# Patient Record
Sex: Male | Born: 1955 | Race: Black or African American | Hispanic: No | Marital: Single | Smoking: Current every day smoker
Health system: Southern US, Community
[De-identification: ages and names within clinical notes are randomized; demographics above are authoritative.]

## PROBLEM LIST (undated history)

## (undated) DIAGNOSIS — K219 Gastro-esophageal reflux disease without esophagitis: Secondary | ICD-10-CM

## (undated) DIAGNOSIS — G959 Disease of spinal cord, unspecified: Secondary | ICD-10-CM

## (undated) DIAGNOSIS — E870 Hyperosmolality and hypernatremia: Secondary | ICD-10-CM

## (undated) DIAGNOSIS — N179 Acute kidney failure, unspecified: Secondary | ICD-10-CM

## (undated) DIAGNOSIS — D649 Anemia, unspecified: Secondary | ICD-10-CM

## (undated) DIAGNOSIS — M5410 Radiculopathy, site unspecified: Secondary | ICD-10-CM

## (undated) DIAGNOSIS — M109 Gout, unspecified: Secondary | ICD-10-CM

## (undated) DIAGNOSIS — E232 Diabetes insipidus: Secondary | ICD-10-CM

## (undated) DIAGNOSIS — E785 Hyperlipidemia, unspecified: Secondary | ICD-10-CM

## (undated) DIAGNOSIS — S0990XA Unspecified injury of head, initial encounter: Secondary | ICD-10-CM

## (undated) DIAGNOSIS — K284 Chronic or unspecified gastrojejunal ulcer with hemorrhage: Secondary | ICD-10-CM

## (undated) DIAGNOSIS — I959 Hypotension, unspecified: Secondary | ICD-10-CM

## (undated) DIAGNOSIS — M1990 Unspecified osteoarthritis, unspecified site: Secondary | ICD-10-CM

## (undated) DIAGNOSIS — Z950 Presence of cardiac pacemaker: Secondary | ICD-10-CM

## (undated) DIAGNOSIS — K274 Chronic or unspecified peptic ulcer, site unspecified, with hemorrhage: Secondary | ICD-10-CM

## (undated) HISTORY — DX: Acute kidney failure, unspecified: N17.9

## (undated) HISTORY — PX: PACEMAKER INSERTION: SHX728

## (undated) HISTORY — PX: INSERT / REPLACE / REMOVE PACEMAKER: SUR710

## (undated) HISTORY — PX: CHOLECYSTECTOMY: SHX55

## (undated) HISTORY — DX: Hyperosmolality and hypernatremia: E87.0

## (undated) HISTORY — PX: CARDIAC CATHETERIZATION: SHX172

## (undated) HISTORY — PX: KNEE SURGERY: SHX244

## (undated) HISTORY — PX: DENTAL SURGERY: SHX609

## (undated) HISTORY — DX: Hypotension, unspecified: I95.9

---

## 2014-08-24 DIAGNOSIS — N183 Chronic kidney disease, stage 3 unspecified: Secondary | ICD-10-CM | POA: Diagnosis present

## 2016-12-20 ENCOUNTER — Encounter (HOSPITAL_COMMUNITY): Payer: Self-pay | Admitting: *Deleted

## 2016-12-20 ENCOUNTER — Emergency Department (HOSPITAL_COMMUNITY): Payer: Medicaid Other

## 2016-12-20 ENCOUNTER — Inpatient Hospital Stay (HOSPITAL_COMMUNITY)
Admission: EM | Admit: 2016-12-20 | Discharge: 2016-12-24 | DRG: 641 | Disposition: A | Discharge status: Skilled Nursing Facility | Payer: Medicaid Other | Attending: Family Medicine | Admitting: Family Medicine

## 2016-12-20 DIAGNOSIS — F17210 Nicotine dependence, cigarettes, uncomplicated: Secondary | ICD-10-CM | POA: Diagnosis present

## 2016-12-20 DIAGNOSIS — W19XXXA Unspecified fall, initial encounter: Secondary | ICD-10-CM | POA: Diagnosis present

## 2016-12-20 DIAGNOSIS — I959 Hypotension, unspecified: Secondary | ICD-10-CM | POA: Diagnosis present

## 2016-12-20 DIAGNOSIS — M109 Gout, unspecified: Secondary | ICD-10-CM | POA: Diagnosis present

## 2016-12-20 DIAGNOSIS — M545 Low back pain: Secondary | ICD-10-CM | POA: Diagnosis present

## 2016-12-20 DIAGNOSIS — N179 Acute kidney failure, unspecified: Secondary | ICD-10-CM | POA: Diagnosis present

## 2016-12-20 DIAGNOSIS — Z950 Presence of cardiac pacemaker: Secondary | ICD-10-CM | POA: Diagnosis not present

## 2016-12-20 DIAGNOSIS — M25562 Pain in left knee: Secondary | ICD-10-CM | POA: Diagnosis present

## 2016-12-20 DIAGNOSIS — D696 Thrombocytopenia, unspecified: Secondary | ICD-10-CM | POA: Diagnosis present

## 2016-12-20 DIAGNOSIS — G40909 Epilepsy, unspecified, not intractable, without status epilepticus: Secondary | ICD-10-CM | POA: Diagnosis present

## 2016-12-20 DIAGNOSIS — E870 Hyperosmolality and hypernatremia: Secondary | ICD-10-CM

## 2016-12-20 DIAGNOSIS — E878 Other disorders of electrolyte and fluid balance, not elsewhere classified: Secondary | ICD-10-CM

## 2016-12-20 DIAGNOSIS — D649 Anemia, unspecified: Secondary | ICD-10-CM | POA: Diagnosis present

## 2016-12-20 DIAGNOSIS — M25561 Pain in right knee: Secondary | ICD-10-CM | POA: Diagnosis present

## 2016-12-20 DIAGNOSIS — E232 Diabetes insipidus: Secondary | ICD-10-CM | POA: Diagnosis present

## 2016-12-20 DIAGNOSIS — E871 Hypo-osmolality and hyponatremia: Secondary | ICD-10-CM | POA: Diagnosis present

## 2016-12-20 HISTORY — DX: Gout, unspecified: M10.9

## 2016-12-20 HISTORY — DX: Hyperosmolality and hypernatremia: E87.0

## 2016-12-20 HISTORY — DX: Diabetes insipidus: E23.2

## 2016-12-20 LAB — BASIC METABOLIC PANEL
BUN: 30 mg/dL — ABNORMAL HIGH (ref 6–20)
BUN: 27 mg/dL — ABNORMAL HIGH (ref 6–20)
CALCIUM: 9.2 mg/dL (ref 8.9–10.3)
CO2: 27 mmol/L (ref 22–32)
CO2: 26 mmol/L (ref 22–32)
Calcium: 8.8 mg/dL — ABNORMAL LOW (ref 8.9–10.3)
Chloride: >130 mmol/L (ref 101–111)
Chloride: >130 mmol/L (ref 101–111)
Creatinine, Ser: 1.68 mg/dL — ABNORMAL HIGH (ref 0.61–1.24)
Creatinine, Ser: 1.6 mg/dL — ABNORMAL HIGH (ref 0.61–1.24)
GFR, EST AFRICAN AMERICAN: 49 mL/min — AB (ref >60)
GFR, EST AFRICAN AMERICAN: 52 mL/min — AB (ref >60)
GFR, EST NON AFRICAN AMERICAN: 43 mL/min — AB (ref >60)
GFR, EST NON AFRICAN AMERICAN: 45 mL/min — AB (ref >60)
Glucose, Bld: 89 mg/dL (ref 65–99)
Glucose, Bld: 103 mg/dL — ABNORMAL HIGH (ref 65–99)
POTASSIUM: 3.2 mmol/L — AB (ref 3.5–5.1)
Potassium: 3.6 mmol/L (ref 3.5–5.1)
SODIUM: 168 mmol/L — AB (ref 135–145)
SODIUM: 163 mmol/L — AB (ref 135–145)

## 2016-12-20 LAB — CBC WITH DIFFERENTIAL/PLATELET
BASOS PCT: 0 %
Basophils Absolute: 0 10*3/uL (ref 0.0–0.1)
EOS ABS: 0.1 10*3/uL (ref 0.0–0.7)
EOS PCT: 2 %
HCT: 40.2 % (ref 39.0–52.0)
Hemoglobin: 11.2 g/dL — ABNORMAL LOW (ref 13.0–17.0)
LYMPHS ABS: 2.1 10*3/uL (ref 0.7–4.0)
Lymphocytes Relative: 48 %
MCH: 27.5 pg (ref 26.0–34.0)
MCHC: 27.9 g/dL — ABNORMAL LOW (ref 30.0–36.0)
MCV: 98.5 fL (ref 78.0–100.0)
MONO ABS: 0.3 10*3/uL (ref 0.1–1.0)
Monocytes Relative: 6 %
Neutro Abs: 1.9 10*3/uL (ref 1.7–7.7)
Neutrophils Relative %: 44 %
Platelets: 112 10*3/uL — ABNORMAL LOW (ref 150–400)
RBC: 4.08 MIL/uL — ABNORMAL LOW (ref 4.22–5.81)
RDW: 16.1 % — AB (ref 11.5–15.5)
WBC: 4.4 10*3/uL (ref 4.0–10.5)

## 2016-12-20 LAB — URINALYSIS, ROUTINE W REFLEX MICROSCOPIC
Bilirubin Urine: NEGATIVE
GLUCOSE, UA: NEGATIVE mg/dL
HGB URINE DIPSTICK: NEGATIVE
Ketones, ur: NEGATIVE mg/dL
LEUKOCYTES UA: NEGATIVE
Nitrite: NEGATIVE
Protein, ur: NEGATIVE mg/dL
SPECIFIC GRAVITY, URINE: 1.025 (ref 1.005–1.030)
pH: 5 (ref 5.0–8.0)

## 2016-12-20 LAB — I-STAT VENOUS BLOOD GAS, ED
Bicarbonate: 25.4 mmol/L (ref 20.0–28.0)
O2 SAT: 76 %
TCO2: 27 mmol/L (ref 0–100)
pCO2, Ven: 42.2 mmHg — ABNORMAL LOW (ref 44.0–60.0)
pH, Ven: 7.387 (ref 7.250–7.430)
pO2, Ven: 42 mmHg (ref 32.0–45.0)

## 2016-12-20 LAB — OSMOLALITY, URINE: Osmolality, Ur: 914 mOsm/kg — ABNORMAL HIGH (ref 300–900)

## 2016-12-20 LAB — COMPREHENSIVE METABOLIC PANEL
ALK PHOS: 140 U/L — AB (ref 38–126)
ALT: 18 U/L (ref 17–63)
AST: 29 U/L (ref 15–41)
Albumin: 2.9 g/dL — ABNORMAL LOW (ref 3.5–5.0)
BUN: 33 mg/dL — AB (ref 6–20)
CALCIUM: 9.7 mg/dL (ref 8.9–10.3)
CO2: 26 mmol/L (ref 22–32)
CREATININE: 1.78 mg/dL — AB (ref 0.61–1.24)
Chloride: >130 mmol/L (ref 101–111)
GFR, EST AFRICAN AMERICAN: 46 mL/min — AB (ref >60)
GFR, EST NON AFRICAN AMERICAN: 40 mL/min — AB (ref >60)
Glucose, Bld: 94 mg/dL (ref 65–99)
Potassium: 4.3 mmol/L (ref 3.5–5.1)
Sodium: 170 mmol/L (ref 135–145)
Total Bilirubin: 0.5 mg/dL (ref 0.3–1.2)
Total Protein: 6.6 g/dL (ref 6.5–8.1)

## 2016-12-20 LAB — RAPID URINE DRUG SCREEN, HOSP PERFORMED
AMPHETAMINES: NOT DETECTED
Barbiturates: NOT DETECTED
Benzodiazepines: NOT DETECTED
Cocaine: NOT DETECTED
Opiates: NOT DETECTED
Tetrahydrocannabinol: NOT DETECTED

## 2016-12-20 LAB — RETICULOCYTES
RBC.: 4.02 MIL/uL — AB (ref 4.22–5.81)
RETIC COUNT ABSOLUTE: 32.2 10*3/uL (ref 19.0–186.0)
Retic Ct Pct: 0.8 % (ref 0.4–3.1)

## 2016-12-20 LAB — TROPONIN I

## 2016-12-20 LAB — TSH: TSH: 1.327 u[IU]/mL (ref 0.350–4.500)

## 2016-12-20 LAB — I-STAT CG4 LACTIC ACID, ED: LACTIC ACID, VENOUS: 1.54 mmol/L (ref 0.5–1.9)

## 2016-12-20 LAB — CBG MONITORING, ED: GLUCOSE-CAPILLARY: 81 mg/dL (ref 65–99)

## 2016-12-20 LAB — FOLATE: Folate: 11.7 ng/mL (ref >5.9)

## 2016-12-20 LAB — SODIUM, URINE, RANDOM: Sodium, Ur: 229 mmol/L

## 2016-12-20 LAB — IRON AND TIBC
IRON: 55 ug/dL (ref 45–182)
Saturation Ratios: 25 % (ref 17.9–39.5)
TIBC: 224 ug/dL — AB (ref 250–450)
UIBC: 169 ug/dL

## 2016-12-20 LAB — VITAMIN B12: VITAMIN B 12: 230 pg/mL (ref 180–914)

## 2016-12-20 LAB — FERRITIN: Ferritin: 53 ng/mL (ref 24–336)

## 2016-12-20 LAB — OSMOLALITY: Osmolality: 365 mOsm/kg (ref 275–295)

## 2016-12-20 MED ORDER — ALLOPURINOL 100 MG PO TABS
100.0000 mg | ORAL_TABLET | Freq: Every day | ORAL | Status: DC
  Administered 2016-12-20 – 2016-12-24 (×5): 100 mg via ORAL
  Filled 2016-12-20 (×5): qty 1

## 2016-12-20 MED ORDER — DESMOPRESSIN ACE SPRAY REFRIG 0.01 % NA SOLN
1.0000 | Freq: Two times a day (BID) | NASAL | Status: DC
  Administered 2016-12-20 – 2016-12-24 (×8): 1 via NASAL
  Filled 2016-12-20 (×2): qty 5

## 2016-12-20 MED ORDER — POTASSIUM CL IN DEXTROSE 5% 20 MEQ/L IV SOLN
20.0000 meq | INTRAVENOUS | Status: DC
  Administered 2016-12-21: 20 meq via INTRAVENOUS
  Filled 2016-12-20: qty 1000

## 2016-12-20 MED ORDER — DEXTROSE 5 % IV SOLN
INTRAVENOUS | Status: DC
  Administered 2016-12-20: 18:00:00 via INTRAVENOUS

## 2016-12-20 MED ORDER — SODIUM CHLORIDE 0.45 % IV SOLN
INTRAVENOUS | Status: DC
  Administered 2016-12-20: 16:00:00 via INTRAVENOUS

## 2016-12-20 MED ORDER — SODIUM CHLORIDE 0.9 % IV BOLUS (SEPSIS)
1000.0000 mL | Freq: Once | INTRAVENOUS | Status: AC
  Administered 2016-12-20: 1000 mL via INTRAVENOUS

## 2016-12-20 MED ORDER — ONDANSETRON HCL 4 MG PO TABS: 4.0000 mg | ORAL_TABLET | Freq: Four times a day (QID) | ORAL | Status: DC | PRN

## 2016-12-20 MED ORDER — SENNOSIDES-DOCUSATE SODIUM 8.6-50 MG PO TABS: 1.0000 | ORAL_TABLET | Freq: Every evening | ORAL | Status: DC | PRN

## 2016-12-20 MED ORDER — ONDANSETRON HCL 4 MG/2ML IJ SOLN: 4.0000 mg | Freq: Four times a day (QID) | INTRAMUSCULAR | Status: DC | PRN

## 2016-12-20 MED ORDER — SODIUM CHLORIDE 0.9% FLUSH
3.0000 mL | Freq: Two times a day (BID) | INTRAVENOUS | Status: DC
  Administered 2016-12-20 – 2016-12-22 (×3): 3 mL via INTRAVENOUS

## 2016-12-20 MED ORDER — ACETAMINOPHEN 325 MG PO TABS
650.0000 mg | ORAL_TABLET | Freq: Four times a day (QID) | ORAL | Status: DC | PRN
  Administered 2016-12-23: 650 mg via ORAL
  Filled 2016-12-20 (×2): qty 2

## 2016-12-20 MED ORDER — POTASSIUM CHLORIDE CRYS ER 20 MEQ PO TBCR
40.0000 meq | EXTENDED_RELEASE_TABLET | Freq: Once | ORAL | Status: AC
  Administered 2016-12-20: 40 meq via ORAL
  Filled 2016-12-20: qty 2

## 2016-12-20 MED ORDER — ENOXAPARIN SODIUM 40 MG/0.4ML ~~LOC~~ SOLN
40.0000 mg | SUBCUTANEOUS | Status: DC
  Administered 2016-12-20: 40 mg via SUBCUTANEOUS
  Filled 2016-12-20: qty 0.4

## 2016-12-20 MED ORDER — ACETAMINOPHEN 650 MG RE SUPP: 650.0000 mg | Freq: Four times a day (QID) | RECTAL | Status: DC | PRN

## 2016-12-20 MED ORDER — POTASSIUM CL IN DEXTROSE 5% 20 MEQ/L IV SOLN: 20.0000 meq | INTRAVENOUS | Status: DC

## 2016-12-20 NOTE — ED Notes (Signed)
Report given to Vicki, RN

## 2016-12-20 NOTE — ED Provider Notes (Signed)
[facility]-EMER[facility]ENCY DEPT Provider Note   CSN: [id number] Arrival date & time: 12/20/16  4098     History   Chief Complaint Chief Complaint  Patient presents with  . Knee Pain    HPI Samuel Gallagher is a 61 y.o. male.  HPI   Pt with hx gout, pacemaker, hypernatremia, diabetes insipidus, brought in by brother for fatigue, multiple falls, slight confusion, shaking.  Pt moved to [place] 3 months ago from [facility] and has not set up PCP here.  Brother notes pt is falling asleep all the time.  Pt reports right knee and low back pain that have been increased over the past few months - had knee surgery 6-7 years ago then "broke a bone" 5 months ago, never finished rehab for the knee.  Pt does not drink much water, urinates frequently.  Denies recent illness or sick symptoms, syncope, chest pain, SOB.  Denies drinking ETOH.     Past Medical History:  Diagnosis Date  . Gout     Patient Active Problem List   Diagnosis Date Noted  . Hypernatremia 12/20/2016  . AKI (acute kidney injury) (HCC) 12/20/2016    Past Surgical History:  Procedure Laterality Date  . KNEE SURGERY         Home Medications    Prior to Admission medications   Medication Sig Start Date End Date Taking? Authorizing Provider  allopurinol (ZYLOPRIM) 100 MG tablet Take 100 mg by mouth daily.   Yes [provider]  desmopressin (DDAVP) 0.01 % SOLN Place 1 spray into the nose 2 (two) times daily.   Yes [provider]    Family History History reviewed. No pertinent family history.  Social History Social History  Substance Use Topics  . Smoking status: Current Every Day Smoker  . Smokeless tobacco: Not on file  . Alcohol use No     Allergies   Patient has no known allergies.   Review of Systems Review of Systems  All other systems reviewed and are negative.    Physical Exam Updated Vital Signs BP (!) 96/45   Pulse (!) 58   Temp 98 F (36.7 C) (Oral)   Resp 16   Ht 5'  7" (1.702 m)   Wt 71.2 kg (157 lb)   SpO2 97%   BMI 24.59 kg/m   Physical Exam  Constitutional: He is oriented to person, place, and time. He appears well-developed and well-nourished. No distress.  HENT:  Head: Normocephalic and atraumatic.  Neck: Neck supple.  Cardiovascular: Normal rate and regular rhythm.   Pulmonary/Chest: Effort normal and breath sounds normal. No respiratory distress. He has no wheezes. He has no rales.  Abdominal: Soft. He exhibits no distension and no mass. There is tenderness (lower abdomen, L>R). There is no rebound and no guarding.  Musculoskeletal: He exhibits no deformity.  Neurological: He is alert and oriented to person, place, and time. He exhibits normal muscle tone.  Moves all extremities equally   Skin: He is not diaphoretic.  Nursing note and vitals reviewed.    ED Treatments / Results  Labs (all labs ordered are listed, but only abnormal results are displayed) Labs Reviewed  COMPREHENSIVE METABOLIC PANEL - Abnormal; Notable for the following:       Result Value   Sodium 170 (*)    Chloride >130 (*)    BUN 33 (*)    Creatinine, Ser 1.78 (*)    Albumin 2.9 (*)    Alkaline Phosphatase 140 (*)  GFR calc non Af Amer 40 (*)    GFR calc Af Amer 46 (*)    All other components within normal limits  CBC WITH DIFFERENTIAL/PLATELET - Abnormal; Notable for the following:    RBC 4.08 (*)    Hemoglobin 11.2 (*)    MCHC 27.9 (*)    RDW 16.1 (*)    Platelets 112 (*)    All other components within normal limits  I-STAT VENOUS BLOOD GAS, ED - Abnormal; Notable for the following:    pCO2, Ven 42.2 (*)    All other components within normal limits  URINALYSIS, ROUTINE W REFLEX MICROSCOPIC  HIV ANTIBODY (ROUTINE TESTING)  TSH  TROPONIN I  TROPONIN I  TROPONIN I  HEMOGLOBIN A1C  OSMOLALITY, URINE  OSMOLALITY  SODIUM, URINE, RANDOM  RAPID URINE DRUG SCREEN, HOSP PERFORMED  BASIC METABOLIC PANEL  BASIC METABOLIC PANEL  BASIC METABOLIC PANEL    BASIC METABOLIC PANEL  VITAMIN B12  FOLATE  IRON AND TIBC  FERRITIN  RETICULOCYTES  CBG MONITORING, ED  I-STAT CG4 LACTIC ACID, ED    EKG  EKG Interpretation None       Radiology Dg Lumbar Spine Complete  Result Date: 12/20/2016 CLINICAL DATA:  RIGHT-side lumbar pain, fell today EXAM: LUMBAR SPINE - COMPLETE 4+ VIEW COMPARISON:  None FINDINGS: Osseous mineralization normal. Five non-rib-bearing lumbar vertebra. Mild scattered vertebral endplate irregularities. Vertebral body heights maintained without fracture or subluxation. Endplate spur formation at L3-L4 and L4-L5. No bone destruction or spondylolysis. SI joints preserved. IMPRESSION: Degenerative disc disease changes lower lumbar spine. No acute abnormalities. Electronically Signed   By: Ulyses SouthwardMark  Boles M.D.   On: 12/20/2016 11:29   Ct Head Wo Contrast  Result Date: 12/20/2016 CLINICAL DATA:  Pain after fall. EXAM: CT HEAD WITHOUT CONTRAST TECHNIQUE: Contiguous axial images were obtained from the base of the skull through the vertex without intravenous contrast. COMPARISON:  None. FINDINGS: Brain: No subdural, epidural, or subarachnoid hemorrhage. Cerebellum, brainstem, and basal cisterns are normal. Dense calcifications in the basal ganglia and bilateral thalamus. Mild white matter changes. No acute cortical ischemia or infarct. No mass effect or midline shift. Vascular: No hyperdense vessel or unexpected calcification. Skull: Normal. Negative for fracture or focal lesion. Sinuses/Orbits: Polyps or mucous retention cysts in the bilateral maxillary sinuses. No acute paranasal sinus abnormality. Mastoid air cells and middle ears are well aerated. Other: None. IMPRESSION: 1. No acute intracranial process. Electronically Signed   By: Gerome Samavid  Williams III M.D   On: 12/20/2016 11:09   Dg Knee Complete 4 Views Right  Result Date: 12/20/2016 CLINICAL DATA:  Right knee pain after fall today. EXAM: RIGHT KNEE - COMPLETE 4+ VIEW COMPARISON:   None. FINDINGS: No acute fracture or dislocation is noted. No joint effusion is noted. Old healed proximal right fibular fracture is noted. Joint spaces are intact. Mild spurring of patella is noted. IMPRESSION: No acute abnormality seen in the right knee. Electronically Signed   By: Lupita RaiderJames  Green Jr, M.D.   On: 12/20/2016 11:29    Procedures Procedures (including critical care time)  Medications Ordered in ED Medications  enoxaparin (LOVENOX) injection 40 mg (not administered)  sodium chloride flush (NS) 0.9 % injection 3 mL (not administered)  0.45 % sodium chloride infusion ( Intravenous New Bag/Given 12/20/16 1600)  acetaminophen (TYLENOL) tablet 650 mg (not administered)    Or  acetaminophen (TYLENOL) suppository 650 mg (not administered)  senna-docusate (Senokot-S) tablet 1 tablet (not administered)  ondansetron (ZOFRAN) tablet 4 mg (not  administered)    Or  ondansetron (ZOFRAN) injection 4 mg (not administered)  allopurinol (ZYLOPRIM) tablet 100 mg (not administered)  desmopressin (DDAVP) 0.01 % (10 mcg/activation) spray 1 spray (not administered)  dextrose 5 % solution (not administered)  sodium chloride 0.9 % bolus 1,000 mL (0 mLs Intravenous Stopped 12/20/16 1322)     Initial Impression / Assessment and Plan / ED Course  I have reviewed the triage vital signs and the nursing notes.  Pertinent labs & imaging results that were available during my care of the patient were reviewed by me and considered in my medical decision making (see chart for details).  Clinical Course as of Dec 21 1610  Sat Dec 20, 2016  1346 Admitted to [facility].  Will   [EW]    Clinical Course User Index [EW] Trixie Dredge, [place]    Afebrile patient with gradually worsening fatigue, sleepiness, recurrent falls, shaking - has hx hypernatremia - workup demonstrates Na 170.  Pt is dry appearing on exam, 1L IVF given. Requested to have secretary request records from [place], [place] hospital for  prior lab results to establish baseline.  Admitted as unassigned to family practice service.    Final Clinical Impressions(s) / ED Diagnoses   Final diagnoses:  Hypernatremia  Disorder of electrolytes    New Prescriptions Current Discharge Medication List       Trixie Dredge, Cordelia Poche 12/20/16 1612    Abelino Derrick, MD 12/21/16 [id number]

## 2016-12-20 NOTE — ED Notes (Signed)
CBG 81.  

## 2016-12-20 NOTE — Progress Notes (Signed)
Samuel Gallagher [id number] Admission Data: 12/20/2016 5:25 PM Attending Provider: Uvaldo RisingFletke, Samuel J, MD  WUJ:[id number]:System, Pcp Not In Consults/ Treatment Team:   Samuel RenderLawrence Gallagher is a 61 y.o. male patient admitted from ED awake, alert  & orientated  X 3,  Full Code, VSS - Blood pressure (!) 96/45, pulse (!) 58, temperature 98 F (36.7 C), temperature source Oral, resp. rate 16, height 5\' 7"  (1.702 m), weight 71.2 kg (157 lb), SpO2 97 %.,, no c/o shortness of breath, no c/o chest pain, no distress noted. Tele # 14 placed and pt is currently running:normal sinus rhythm.   IV site WDL:  hand right, condition patent and no redness with a transparent dsg that's clean dry and intact.  Allergies:  No Known Allergies   Past Medical History:  Diagnosis Date  . Gout     History:  obtained from chart review. Tobacco/alcohol: denied none  Pt orientation to unit, room and routine. Information packet given to patient/family and safety video watched.  Admission INP armband ID verified with patient/family, and in place. SR up x 2, fall risk assessment complete with Patient and family verbalizing understanding of risks associated with falls. Pt verbalizes an understanding of how to use the call bell and to call for help before getting out of bed.  Skin, clean-dry- intact without evidence of bruising, or skin tears.   No evidence of skin break down noted on exam. color normal, vascularity normal, no rashes or suspicious lesions, no evidence of bleeding or bruising, no lesions noted, no rash, no edema, temperature normal, texture normal, mobility and turgor normal, nails normal without clubbing    Will cont to monitor and assist as needed.  Samuel FlamingVicki L Laiana Fratus, RN 12/20/2016 5:25 PM

## 2016-12-20 NOTE — ED Triage Notes (Signed)
Pt has very vague complaints, states that he has right knee pain following surgery in past. Having difficulty ambulating at home.

## 2016-12-20 NOTE — Progress Notes (Addendum)
CRITICAL VALUE ALERT  Critical Value:  Serum Osmolality 365 Follow up call from Lab: Sodium 168                                        Chloride >130  Date & Time Notied:  12/20/16 17:27  Provider Notified: Dr. Randolm IdolFletke  Orders Received/Actions taken:

## 2016-12-20 NOTE — ED Notes (Signed)
Patient transported to CT 

## 2016-12-20 NOTE — ED Notes (Signed)
[name], [place] informed of critical sodium and chloride.

## 2016-12-20 NOTE — H&P (Signed)
Family Medicine Teaching [facility] Admission History and Physical Service Pager: [phone]  Patient name: Samuel Gallagher Medical record number: [id number] Date of birth: 16-Nov-1955 Age: 61 y.o. Gender: male  Primary Care Provider: System, Pcp Not In Consultants: None  Code Status: Full   Chief Complaint: weakness and fatigue   Assessment and Plan: Samuel Gallagher is a 61 y.o. male presenting with fatigue found to be severely hypernatremic. 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 Endorses polyuria. Denies significant thirst. Normal mental status. Has recently been off of desmopressin for 3-4 days. Likely hypernatremia/hypercholermia due to diabetes insipidus. Significant freewater deficiency of about 9 L. Received 1 L of normal saline in the ED. - Admit to inpatient Dr. Randolm Idol, telemetry - Awaiting release of records, pending records consider MRI to work up central DI - Urine osmality, Urine sodium, Serum osomality  - Restart home desmopressin  - Will not drop sodium by greater than 12 meq over the next 24 hours to avoid cerebral edema, or 0.5 meq per hr  - Will start patient on D5W x 120 ml/hr x 24 hours  - BMET q4 hrs  - Discussed briefly with nephrology - Awaiting records from previous hospital in [place]   Fatigue/Fall: Presyncopal falls most concerning for orthostatic hypotension in the setting of a polyuria and diabetes insipidus. However patient with a pacemaker placed about 8 years ago and unsure of his cardiac history though denies any cardiac medications. No chest pain or shortness of breath. CT head negative for acute findings, normal neurologic exam. Most likely concerning for orthostatics and electrolyte disturbance, however will work up for cardiac as well. No significant medications that could be contributing.  Note patient is slightly anemic, no overt symptoms of bleeding, possibly contributing  - Obtain orthostatic vitals - Will trend troponin - EKG on admission - PT/ OT eval and treat - RPR/HIV/TSH, B12 and folate  - Obtain UDS and ethanol level   Lower Back/Knee pain 2/2 most recent fall. No longer as painful. - Tylenol as needed for pain  - Lumbar xray -degenerative disease  - Right knee without an signs of fracture   AKI: Scr 1.78 mostly likely prerenal due to acute fluid losses. UA without signs of ATN or infection.  - continue to follow  - if no significant improvement consider renal ultrasound and FENa   Normocytic Anemia: Per patient hx of colonoscopy 5 years that was completely normal. No overt signs of bleeding.  - Obtain FOBT  - Anemia Panel    Thrombocytopenia: Platelets 112 on admission  - continue to follow for now  - pending records  Hypotensive: Likely in part due to dehydration, however also per patient he tends to SBP in 90s to 100s  - Continue to follow closely     FEN/GI: Regular diet  Prophylaxis: Lovenox   Disposition: Home  History of Present Illness:  Samuel Gallagher is a 61 y.o. male presenting with fatigue. Patient states that he has had several falls lately. He states that he becomes dizzy especially when getting up from sitting to standing. if he does not have something nearby hold onto, he falls. He denies any shortness of breath or chest pain associated with these fall. He indicates that he does not ever lose complete consciousness. He denies any recent history of the GI symptoms, including nausea vomiting or diarrhea. Denies any melena, hematochezia. However, he  does indicate that he is slightly constipated lately. Diagnosed several years back with diabetes insipidus and takes desmopressin. He states previously during one of hospitalization for elevated sodium, he had seizures associated with it. Recently, he has been out of desmopressin for the past 4  days partly because his pharmacy did not have this medication on hand.  His brother  indicates that the patient has also been more fatigued lately over about the past 3 weeks, states sleep 12 hours a day. Though no weight loss, good appetite. Patient has been living with his brother for the past 2 months. [place] no longer had a place to stay there so moved [place] to [place]. Patient has a history of significant for polyuria, indicates that he has been urinating frequently and does not drink much. I asked why he is not drinking much water, he states he does not like the taste of it. Patient denies any fevers, headaches, focal weakness or numbness. Patient's blood pressures where low on admission, per patient his blood pressures tend to run between 90s - 100s based on previous hospitalizations. Patient also indicates having some back and knee pain associated with his most recent fall.    Review Of Systems: Per HPI with the following additions: Review of Systems  Constitutional: Positive for malaise/fatigue. Negative for fever and weight loss.  HENT: Negative for congestion and sinus pain.   Eyes: Negative for blurred vision and double vision.  Respiratory: Negative for cough, sputum production and shortness of breath.   Cardiovascular: Negative for chest pain, palpitations and leg swelling.  Gastrointestinal: Positive for constipation. Negative for abdominal pain, blood in stool, diarrhea, melena, nausea and vomiting.  Genitourinary: Positive for urgency. Negative for dysuria.  Musculoskeletal: Negative for myalgias.  Skin: Negative for rash.  Neurological: Positive for dizziness. Negative for focal weakness, loss of consciousness and headaches.  Endo/Heme/Allergies: Negative for polydipsia.    There are no active problems to display for this patient.   Past Medical History: Past Medical History:  Diagnosis Date  . Gout     Past Surgical History: Past  Surgical History:  Procedure Laterality Date  . KNEE SURGERY      Social History: Social History  Substance Use Topics  . Smoking status: Current Every Day Smoker  . Smokeless tobacco: Not on file  . Alcohol use No   Additional social history: Please also refer to relevant sections of EMR.  Family History: History reviewed. No pertinent family history. Mother HTN  Father HTN  Brother Afib   Allergies and Medications: No Known Allergies No current facility-administered medications on file prior to encounter.    No current outpatient prescriptions on file prior to encounter.    Objective: BP 92/63   Pulse 60   Temp 98 F (36.7 C) (Oral)   Resp 16   Ht 5\' 7"  (1.702 m)   Wt 157 lb (71.2 kg)   SpO2 97%   BMI 24.59 kg/m  Exam: General: Elderly gentleman, lying in bed, NAD Eyes: PERRLA, EOMI ENTM: Slightly dry mucosa membranes Neck: Negative for lymphadenpathy  Cardiovascular: RRR, no murmur or rubs  Respiratory: CTAB, no increased WOB Gastrointestinal: BS+, no ttp, no distention, trochanter scars from gallbladder removal  MSK: No lower extremity edema, 2+ DP pulses Derm: no rashes no ulcers  Neuro: 5 out of 5 strength in upper and lower extremities, Cn 2-7 intact, no gross changes in sensation  Psych: cooperative, alert and orientated x 3   Labs  and Imaging: CBC BMET   Recent Labs Lab 12/20/16 1133  WBC 4.4  HGB 11.2*  HCT 40.2  PLT 112*    Recent Labs Lab 12/20/16 1133  NA 170*  K 4.3  CL >130*  CO2 26  BUN 33*  CREATININE 1.78*  GLUCOSE 94  CALCIUM 9.7      Dg Lumbar Spine Complete  Result Date: 12/20/2016 CLINICAL DATA:  RIGHT-side lumbar pain, fell today EXAM: LUMBAR SPINE - COMPLETE 4+ VIEW COMPARISON:  None FINDINGS: Osseous mineralization normal. Five non-rib-bearing lumbar vertebra. Mild scattered vertebral endplate irregularities. Vertebral body heights maintained without fracture or subluxation. Endplate spur formation at L3-L4 and  L4-L5. No bone destruction or spondylolysis. SI joints preserved. IMPRESSION: Degenerative disc disease changes lower lumbar spine. No acute abnormalities. Electronically Signed   By: Ulyses Southward M.D.   On: 12/20/2016 11:29   Ct Head Wo Contrast  Result Date: 12/20/2016 CLINICAL DATA:  Pain after fall. EXAM: CT HEAD WITHOUT CONTRAST TECHNIQUE: Contiguous axial images were obtained from the base of the skull through the vertex without intravenous contrast. COMPARISON:  None. FINDINGS: Brain: No subdural, epidural, or subarachnoid hemorrhage. Cerebellum, brainstem, and basal cisterns are normal. Dense calcifications in the basal ganglia and bilateral thalamus. Mild white matter changes. No acute cortical ischemia or infarct. No mass effect or midline shift. Vascular: No hyperdense vessel or unexpected calcification. Skull: Normal. Negative for fracture or focal lesion. Sinuses/Orbits: Polyps or mucous retention cysts in the bilateral maxillary sinuses. No acute paranasal sinus abnormality. Mastoid air cells and middle ears are well aerated. Other: None. IMPRESSION: 1. No acute intracranial process. Electronically Signed   By: Gerome Sam III M.D   On: 12/20/2016 11:09   Dg Knee Complete 4 Views Right  Result Date: 12/20/2016 CLINICAL DATA:  Right knee pain after fall today. EXAM: RIGHT KNEE - COMPLETE 4+ VIEW COMPARISON:  None. FINDINGS: No acute fracture or dislocation is noted. No joint effusion is noted. Old healed proximal right fibular fracture is noted. Joint spaces are intact. Mild spurring of patella is noted. IMPRESSION: No acute abnormality seen in the right knee. Electronically Signed   By: Lupita Raider, M.D.   On: 12/20/2016 11:29   Berton Bon, MD 12/20/2016, 1:42 PM PGY-2, [facility] FPTS Intern pager: [phone], text pages welcome

## 2016-12-21 ENCOUNTER — Encounter (HOSPITAL_COMMUNITY): Payer: Self-pay

## 2016-12-21 ENCOUNTER — Inpatient Hospital Stay (HOSPITAL_COMMUNITY): Payer: Medicaid Other

## 2016-12-21 LAB — COMPREHENSIVE METABOLIC PANEL
ALBUMIN: 2.4 g/dL — AB (ref 3.5–5.0)
ALT: 16 U/L — ABNORMAL LOW (ref 17–63)
AST: 23 U/L (ref 15–41)
Alkaline Phosphatase: 120 U/L (ref 38–126)
BILIRUBIN TOTAL: 0.1 mg/dL — AB (ref 0.3–1.2)
BUN: 27 mg/dL — ABNORMAL HIGH (ref 6–20)
CALCIUM: 8.8 mg/dL — AB (ref 8.9–10.3)
CO2: 27 mmol/L (ref 22–32)
CREATININE: 1.71 mg/dL — AB (ref 0.61–1.24)
Chloride: >130 mmol/L (ref 101–111)
GFR, EST AFRICAN AMERICAN: 48 mL/min — AB (ref >60)
GFR, EST NON AFRICAN AMERICAN: 42 mL/min — AB (ref >60)
Glucose, Bld: 106 mg/dL — ABNORMAL HIGH (ref 65–99)
Potassium: 3.9 mmol/L (ref 3.5–5.1)
Sodium: 164 mmol/L (ref 135–145)
TOTAL PROTEIN: 5.8 g/dL — AB (ref 6.5–8.1)

## 2016-12-21 LAB — BASIC METABOLIC PANEL
ANION GAP: 2 — AB (ref 5–15)
ANION GAP: 3 — AB (ref 5–15)
BUN: 28 mg/dL — ABNORMAL HIGH (ref 6–20)
BUN: 25 mg/dL — ABNORMAL HIGH (ref 6–20)
BUN: 20 mg/dL (ref 6–20)
BUN: 20 mg/dL (ref 6–20)
CALCIUM: 8.8 mg/dL — AB (ref 8.9–10.3)
CALCIUM: 8.9 mg/dL (ref 8.9–10.3)
CALCIUM: 8.6 mg/dL — AB (ref 8.9–10.3)
CO2: 24 mmol/L (ref 22–32)
CO2: 24 mmol/L (ref 22–32)
CO2: 26 mmol/L (ref 22–32)
CO2: 26 mmol/L (ref 22–32)
CREATININE: 1.66 mg/dL — AB (ref 0.61–1.24)
Calcium: 8.9 mg/dL (ref 8.9–10.3)
Chloride: >130 mmol/L (ref 101–111)
Chloride: 130 mmol/L — ABNORMAL HIGH (ref 101–111)
Chloride: 123 mmol/L — ABNORMAL HIGH (ref 101–111)
Creatinine, Ser: 1.69 mg/dL — ABNORMAL HIGH (ref 0.61–1.24)
Creatinine, Ser: 1.54 mg/dL — ABNORMAL HIGH (ref 0.61–1.24)
Creatinine, Ser: 1.67 mg/dL — ABNORMAL HIGH (ref 0.61–1.24)
GFR calc Af Amer: 55 mL/min — ABNORMAL LOW (ref >60)
GFR calc Af Amer: 50 mL/min — ABNORMAL LOW (ref >60)
GFR calc non Af Amer: 43 mL/min — ABNORMAL LOW (ref >60)
GFR, EST AFRICAN AMERICAN: 49 mL/min — AB (ref >60)
GFR, EST AFRICAN AMERICAN: 50 mL/min — AB (ref >60)
GFR, EST NON AFRICAN AMERICAN: 42 mL/min — AB (ref >60)
GFR, EST NON AFRICAN AMERICAN: 47 mL/min — AB (ref >60)
GFR, EST NON AFRICAN AMERICAN: 43 mL/min — AB (ref >60)
GLUCOSE: 102 mg/dL — AB (ref 65–99)
GLUCOSE: 104 mg/dL — AB (ref 65–99)
Glucose, Bld: 118 mg/dL — ABNORMAL HIGH (ref 65–99)
Glucose, Bld: 99 mg/dL (ref 65–99)
POTASSIUM: 3.4 mmol/L — AB (ref 3.5–5.1)
POTASSIUM: 3.9 mmol/L (ref 3.5–5.1)
POTASSIUM: 3.4 mmol/L — AB (ref 3.5–5.1)
Potassium: 3.4 mmol/L — ABNORMAL LOW (ref 3.5–5.1)
SODIUM: 165 mmol/L — AB (ref 135–145)
SODIUM: 158 mmol/L — AB (ref 135–145)
SODIUM: 152 mmol/L — AB (ref 135–145)
Sodium: 163 mmol/L (ref 135–145)

## 2016-12-21 LAB — CBC
HCT: 34.8 % — ABNORMAL LOW (ref 39.0–52.0)
HEMOGLOBIN: 10.1 g/dL — AB (ref 13.0–17.0)
MCH: 28.4 pg (ref 26.0–34.0)
MCHC: 29 g/dL — ABNORMAL LOW (ref 30.0–36.0)
MCV: 97.8 fL (ref 78.0–100.0)
PLATELETS: 91 10*3/uL — AB (ref 150–400)
RBC: 3.56 MIL/uL — AB (ref 4.22–5.81)
RDW: 16.3 % — ABNORMAL HIGH (ref 11.5–15.5)
WBC: 4.3 10*3/uL (ref 4.0–10.5)

## 2016-12-21 LAB — SAVE SMEAR

## 2016-12-21 LAB — ETHANOL

## 2016-12-21 LAB — HEMOGLOBIN A1C
HEMOGLOBIN A1C: 5.5 % (ref 4.8–5.6)
MEAN PLASMA GLUCOSE: 111 mg/dL

## 2016-12-21 LAB — TROPONIN I: Troponin I: <0.03 ng/mL (ref <0.03)

## 2016-12-21 LAB — HIV ANTIBODY (ROUTINE TESTING W REFLEX): HIV Screen 4th Generation wRfx: NONREACTIVE

## 2016-12-21 LAB — CORTISOL-AM, BLOOD: CORTISOL - AM: 5.1 ug/dL — AB (ref 6.7–22.6)

## 2016-12-21 LAB — LACTATE DEHYDROGENASE: LDH: 201 U/L — ABNORMAL HIGH (ref 98–192)

## 2016-12-21 MED ORDER — POTASSIUM CHLORIDE CRYS ER 20 MEQ PO TBCR
40.0000 meq | EXTENDED_RELEASE_TABLET | Freq: Once | ORAL | Status: AC
  Administered 2016-12-22: 40 meq via ORAL
  Filled 2016-12-21: qty 2

## 2016-12-21 MED ORDER — DEXTROSE 5 % IV SOLN
INTRAVENOUS | Status: DC
  Administered 2016-12-21 – 2016-12-23 (×4): via INTRAVENOUS
  Administered 2016-12-24: 50 mL via INTRAVENOUS

## 2016-12-21 MED ORDER — POTASSIUM CHLORIDE CRYS ER 20 MEQ PO TBCR
40.0000 meq | EXTENDED_RELEASE_TABLET | Freq: Once | ORAL | Status: AC
  Administered 2016-12-21: 40 meq via ORAL
  Filled 2016-12-21: qty 2

## 2016-12-21 MED ORDER — POTASSIUM CL IN DEXTROSE 5% 20 MEQ/L IV SOLN
20.0000 meq | INTRAVENOUS | Status: DC
  Administered 2016-12-21: 20 meq via INTRAVENOUS
  Filled 2016-12-21: qty 1000

## 2016-12-21 MED ORDER — POTASSIUM CL IN DEXTROSE 5% 20 MEQ/L IV SOLN
20.0000 meq | INTRAVENOUS | Status: DC
  Filled 2016-12-21: qty 1000

## 2016-12-21 MED ORDER — COSYNTROPIN 0.25 MG IJ SOLR
0.2500 mg | Freq: Once | INTRAMUSCULAR | Status: AC
  Administered 2016-12-22: 0.25 mg via INTRAVENOUS
  Filled 2016-12-21: qty 0.25

## 2016-12-21 NOTE — Evaluation (Signed)
Occupational Therapy Evaluation Patient Details Name: Samuel RenderLawrence Nold MRN: [id number] DOB: [birth year] Today's Date: 12/21/2016    History of Present Illness Samuel Gallagher is a 61 y.o. male presenting with fatigue found to be severely hypernatremic. PMH is significant for diabetes insipidus, gout, pacemaker placement 8 years ago   Clinical Impression   PTA, pt reports independence with RW for basic ADL and functional mobility. He lives with his older brother who assists with cooking. Pt currently requires min assist for toilet transfers, min guard assist for LB ADL, and supervision for seated UB ADL for safety. Pt presents with decreased dynamic standing balance, decreased B UE strength and coordination, and decreased activity tolerance for ADL impacting ability to participate in ADL at PLOF. He would benefit from short-term SNF level rehabilitation in order to maximize return to PLOF prior to returning home. OT will continue to follow while admitted.    Follow Up Recommendations  SNF;Supervision/Assistance - 24 hour    Equipment Recommendations  Other (comment) (TBD at next venue of care)    Recommendations for Other Services       Precautions / Restrictions Precautions Precautions: Fall Restrictions Weight Bearing Restrictions: No      Mobility Bed Mobility Overal bed mobility: Needs Assistance Bed Mobility: Supine to Sit     Supine to sit: Supervision     General bed mobility comments: Supervision for safety.   Transfers Overall transfer level: Needs assistance Equipment used: Rolling walker (2 wheeled) Transfers: Sit to/from Stand Sit to Stand: Min guard         General transfer comment: Min guard for steadying and balance during rise to stand. Decreased dynamic standing balance and requiring min assist for mobility once standing.     Balance Overall balance assessment: Needs assistance;History of Falls Sitting-balance support: No upper extremity  supported;Feet supported Sitting balance-Leahy Scale: Good     Standing balance support: Single extremity supported Standing balance-Leahy Scale: Poor Standing balance comment: Reliant on single UE support at sink during grooming tasks.                            ADL either performed or assessed with clinical judgement   ADL Overall ADL's : Needs assistance/impaired Eating/Feeding: Set up;Sitting   Grooming: Supervision/safety;Standing   Upper Body Bathing: Supervision/ safety;Sitting   Lower Body Bathing: Min guard;Sit to/from stand   Upper Body Dressing : Supervision/safety;Sitting   Lower Body Dressing: Min guard;Sit to/from stand   Toilet Transfer: Minimal assistance;Ambulation;RW Toilet Transfer Details (indicate cue type and reason): Simulated in room with sit<>stand followed by functional mobility.  Toileting- ArchitectClothing Manipulation and Hygiene: Min guard;Sit to/from stand       Functional mobility during ADLs: Minimal assistance;Rolling walker General ADL Comments: Decreased stability in standing position and decreased activity tolerance for ADL.     Vision Patient Visual Report: No change from baseline Vision Assessment?: No apparent visual deficits     Perception     Praxis      Pertinent Vitals/Pain Pain Assessment: No/denies pain     Hand Dominance Right   Extremity/Trunk Assessment Upper Extremity Assessment Upper Extremity Assessment: Generalized weakness;RUE deficits/detail;LUE deficits/detail RUE Deficits / Details: Decreased gross and fine motor coordination noted with difficulty manipulating utensils. LUE Deficits / Details: Slow and uncoordinated movements.    Lower Extremity Assessment Lower Extremity Assessment: Generalized weakness       Communication Communication Communication: Expressive difficulties   Cognition Arousal/Alertness: Awake/alert Behavior During Therapy:  WFL for tasks assessed/performed Overall Cognitive  Status: Within Functional Limits for tasks assessed                                     General Comments  Pt noted to be coughing when taking sips of his juice when PT entered room. RN notified. See orthostatics under vitals tab    Exercises     Shoulder Instructions      Home Living Family/patient expects to be discharged to:: Private residence Living Arrangements: Other relatives (older brother) Available Help at Discharge: Family;Available 24 hours/day Type of Home: House Home Access: Level entry     Home Layout: One level     Bathroom Shower/Tub: Chief Strategy Officer: Standard     Home Equipment: Grab bars - tub/shower;Shower seat (states RW destroyed)          Prior Functioning/Environment Level of Independence: Independent with assistive device(s)        Comments: Was using a rolling walker until it got "tore up."  Brother cooks meals        OT Problem List: Decreased strength;Decreased activity tolerance;Impaired balance (sitting and/or standing);Decreased coordination;Decreased safety awareness;Decreased knowledge of use of DME or AE;Decreased knowledge of precautions;Pain;Impaired UE functional use      OT Treatment/Interventions: Self-care/ADL training;Therapeutic exercise;Energy conservation;DME and/or AE instruction;Therapeutic activities;Patient/family education;Balance training    OT Goals(Current goals can be found in the care plan section) Acute Rehab OT Goals Patient Stated Goal: Feel better OT Goal Formulation: With patient Time For Goal Achievement: 01/04/17 Potential to Achieve Goals: Good ADL Goals Pt Will Perform Grooming: with modified independence;standing Pt Will Perform Upper Body Dressing: with modified independence;sitting Pt Will Perform Lower Body Dressing: with modified independence;sit to/from stand Pt Will Transfer to Toilet: with modified independence;ambulating;bedside commode (BSC over toilet) Pt  Will Perform Toileting - Clothing Manipulation and hygiene: with modified independence;sit to/from stand Pt/caregiver will Perform Home Exercise Program: Both right and left upper extremity;With written HEP provided (increased strength and increased coordination)  OT Frequency: Min 2X/week   Barriers to D/C:            Co-evaluation              AM-PAC PT "6 Clicks" Daily Activity     Outcome Measure Help from another person eating meals?: A Little Help from another person taking care of personal grooming?: A Little Help from another person toileting, which includes using toliet, bedpan, or urinal?: A Little Help from another person bathing (including washing, rinsing, drying)?: A Little Help from another person to put on and taking off regular upper body clothing?: A Little Help from another person to put on and taking off regular lower body clothing?: A Little 6 Click Score: 18   End of Session Equipment Utilized During Treatment: Gait belt;Rolling walker  Activity Tolerance: Patient tolerated treatment well Patient left: in bed;with call bell/phone within reach;with bed alarm set  OT Visit Diagnosis: Other abnormalities of gait and mobility (R26.89);Muscle weakness (generalized) (M62.81)                Time: 0981-1914 OT Time Calculation (min): 15 min Charges:  OT General Charges $OT Visit: 1 Procedure OT Evaluation $OT Eval Moderate Complexity: 1 Procedure G-Codes:     Doristine Section, MS OTR/L  Pager: [phone]   Antone Summons A Sherrick Araki 12/21/2016, 3:57 PM

## 2016-12-21 NOTE — Evaluation (Signed)
Physical Therapy Evaluation Patient Details Name: Samuel Gallagher Aburto MRN: [id number] DOB: 13-Feb-1956 Today's Date: 12/21/2016   History of Present Illness  Samuel Gallagher Bilello is a 61 y.o. male presenting with fatigue found to be severely hypernatremic. PMH is significant for diabetes insipidus, gout, pacemaker placement 8 years ago  Clinical Impression  Pt admitted with the above complicaions. Pt currently with functional limitations due to the deficits listed below (see PT Problem List). Demonstrates ataxia with gait and gross body movements. Instability with transfer and gait requiring min assist for balance. Lives with his brother at home and was Mod I prior to admission Pt will benefit from skilled PT to increase their independence and safety with mobility to allow discharge to the venue listed below.       Follow Up Recommendations SNF (short term) (Also pending progress during admission)    Equipment Recommendations   (TBD next venue of care.)    Recommendations for Other Services OT consult;Other (comment) (Consider swallow screen.)     Precautions / Restrictions Precautions Precautions: Fall Restrictions Weight Bearing Restrictions: No      Mobility  Bed Mobility Overal bed mobility: Needs Assistance Bed Mobility: Supine to Sit     Supine to sit: Supervision     General bed mobility comments: supervision for safety, no assistance required. Some difficulty coordinating himself to EOB. Cues for technique.  Transfers Overall transfer level: Needs assistance Equipment used: Rolling walker (2 wheeled) Transfers: Sit to/from Stand Sit to Stand: Min assist         General transfer comment: Min assist for balance while rising from bed.  VC for hand placement. Once upright required mod UE support through walker for stability.  Ambulation/Gait Ambulation/Gait assistance: Min assist Ambulation Distance (Feet): 30 Feet Assistive device: Rolling walker (2 wheeled) Gait  Pattern/deviations: Step-through pattern;Decreased stride length;Ataxic;Staggering left;Staggering right;Narrow base of support Gait velocity: slow Gait velocity interpretation: <1.8 ft/sec, indicative of risk for recurrent falls General Gait Details: Min assist for balance and walker control.  VC for walker placement frequently for proximity. Slight buckling through BIL knees but able to maintain support with UE use on RW. Cues for sequencing especially with turns.  Stairs            Wheelchair Mobility    Modified Rankin (Stroke Patients Only)       Balance Overall balance assessment: Needs assistance;History of Falls Sitting-balance support: No upper extremity supported;Feet supported Sitting balance-Leahy Scale: Good     Standing balance support: Single extremity supported Standing balance-Leahy Scale: Poor Standing balance comment: Reliant on RW for support.                             Pertinent Vitals/Pain Pain Assessment: No/denies pain    Home Living Family/patient expects to be discharged to:: Private residence Living Arrangements: Other relatives (older brother) Available Help at Discharge: Family;Available 24 hours/day Type of Home: House Home Access: Level entry     Home Layout: One level Home Equipment: Grab bars - tub/shower;Shower seat (States RW destroyed)      Prior Function Level of Independence: Independent with assistive device(s)         Comments: Was using a rolling walker until it got "tore up."  Brother cooks meals     Hand Dominance   Dominant Hand: Right    Extremity/Trunk Assessment   Upper Extremity Assessment Upper Extremity Assessment: Defer to OT evaluation    Lower Extremity Assessment Lower  Extremity Assessment: Generalized weakness       Communication   Communication: Expressive difficulties  Cognition Arousal/Alertness: Awake/alert Behavior During Therapy: WFL for tasks assessed/performed Overall  Cognitive Status: Within Functional Limits for tasks assessed                                        General Comments General comments (skin integrity, edema, etc.): Pt noted to be coughing when taking sips of his juice when PT entered room. RN notified. See orthostatics under vitals tab    Exercises     Assessment/Plan    PT Assessment Patient needs continued PT services  PT Problem List Decreased strength;Decreased activity tolerance;Decreased balance;Decreased mobility;Decreased coordination;Decreased knowledge of use of DME       PT Treatment Interventions DME instruction;Gait training;Functional mobility training;Therapeutic activities;Therapeutic exercise;Balance training;Neuromuscular re-education;Patient/family education    PT Goals (Current goals can be found in the Care Plan section)  Acute Rehab PT Goals Patient Stated Goal: Feel better PT Goal Formulation: With patient Time For Goal Achievement: 01/04/17 Potential to Achieve Goals: Good    Frequency Min 3X/week   Barriers to discharge        Co-evaluation               AM-PAC PT "6 Clicks" Daily Activity  Outcome Measure Difficulty turning over in bed (including adjusting bedclothes, sheets and blankets)?: None Difficulty moving from lying on back to sitting on the side of the bed? : A Little Difficulty sitting down on and standing up from a chair with arms (e.g., wheelchair, bedside commode, etc,.)?: A Little Help needed moving to and from a bed to chair (including a wheelchair)?: A Little Help needed walking in hospital room?: A Lot Help needed climbing 3-5 steps with a railing? : A Lot 6 Click Score: 17    End of Session Equipment Utilized During Treatment: Gait belt Activity Tolerance: Patient tolerated treatment well Patient left: in chair;with call bell/phone within reach;with chair alarm set Nurse Communication: Mobility status;Other (comment);Precautions (Frequently coughing  after taking sips of juice) PT Visit Diagnosis: Unsteadiness on feet (R26.81);Muscle weakness (generalized) (M62.81);Ataxic gait (R26.0)    Time: 1610-9604 PT Time Calculation (min) (ACUTE ONLY): 25 min   Charges:   PT Evaluation $PT Eval Moderate Complexity: 1 Procedure PT Treatments $Gait Training: 8-22 mins   PT G Codes:   PT G-Codes **NOT FOR INPATIENT CLASS** Functional Assessment Tool Used: AM-PAC 6 Clicks Basic Mobility;Clinical judgement Functional Limitation: Mobility: Walking and moving around Mobility: Walking and Moving Around Current Status (V4098): At least 40 percent but less than 60 percent impaired, limited or restricted Mobility: Walking and Moving Around Goal Status [id number]): At least 1 percent but less than 20 percent impaired, limited or restricted    Charlsie Merles, PT, DPT [phone]   Berton Mount 12/21/2016, 2:40 PM

## 2016-12-21 NOTE — Progress Notes (Signed)
Family Medicine Teaching Service Daily Progress Note Intern Pager: [phone]  Patient name: Samuel Gallagher Medical record number: [id number] Date of birth: 1956/05/28 Age: 61 y.o. Gender: male  Primary Care Provider: System, Pcp Not In Consultants: nephrology over the phone on admission Code Status: Full  Pt Overview and Major Events to Date:  05/26-admitted with hyponatremia to 170  Assessment and Plan: Samuel RenderLawrence Venus is a 61 y.o. male presenting with fatigue found to be severely hypernatremic. PMH is significant for diabetes insipidus, gout, pacemaker placement 8 years ago  Hypernatremia/Hypercholoredimia:  History of diabetes insipidus on desmopressin at home that he didn't take in the last 3-4 days. However, labs (urine osmolality >600-800 and urine Na to 229) suggest mineral corticosteroid use versus diabetes insipidus but patient denies history of steroid use. His a.m. cortisol is low to 5.1 this morning. He is also hypotensive to 90/30's. Sodium elevated to 170s on admission and trended to 163 after IVF. Initial water deficit about 10 LWonder if he was on mineralocorticoid steroid as an outpatient which could explain his picture to some extent.  - Awaiting release of records, pending records consider MRI to work up central DI - Restarted home desmopressin  - Na 165. Free water deficit 8.25 L. Need D5W@137ml /hr to correct his sodium at the rate of 10 mEq/24 hours over the next 60 hours. However, he is on desmopressin which could accelerate his Na correction so will leave D5W@125cc /hr to be on safe side. He is taking fluid by mouth as well.  - BMET q4 hrs  - will review his outside medical record as well  Hypotension: BP 94/37 this morning. Baseline SBP in 90s to 100s per patient. AM cortisol low to 5.1 this morning. Denies history of adrenal insufficiency or steroid use in the past - Check ACTH level today - Morning ACTH stimulation test tomorrow  Fatigue/Fall: presyncopal falls  most concerning for orthostatic hypotension in the setting of a polyuria and diabetes insipidus. Patient with history of sick sinus syndrome on pacemaker for the last 8 years. CT head negative for acute findings, normal neurologic exam. EKG with paced sinus rhythm. Trop neg x3. HIV, TSH and vitamin B12 within normal limits.  - PT/ OT eval and treat - Follow up RPR/HIV/TSH, B12 and folate   - Obtain UDS and ethanol level   Lower Back/Knee pain 2/2 most recent fall: Complains right knee pain. Exam within normal limits except for the previous surgical scar and some tenderness to palpation over the medial aspect. Lumbar spine x-ray and right knee x-ray without acute findings - Tylenol as needed for pain   - Ice pack  AKI: Scr 1.66, 1.78 on admission. UA without signs of ATN or infection.   - continue to follow  - if no significant improvement consider renal ultrasound and FENa   Normocytic Anemia: Per patient hx of colonoscopy 5 years that was completely normal. No overt signs of bleeding. Iron panel within normal except for mildly low TIBC favoring ACD. Retic 0.8, corrects to 0.6 with a RI of 0.43 suggestive for hypo-proliferation. He also have thrombocytopenia 91.  - Follow up FOBT  - Will consider consulting hem/onc  Thrombocytopenia: Platelets 91, 112 on admission - Continue daily cbc - Discontinued Lovenox. SCD  FEN/GI: Regular diet   Prophylaxis: Change SCD given thrombocytopenia  Disposition:  continuing inpatient management pending clinical improvement.  Subjective:  Patient was sleepy but arises easily and responds to questions appropriately. He complains about the left knee pain secondary to fall.  He denies chest pain or shortness of breath.   Objective: Temp:  [98.4 F (36.9 C)-99 F (37.2 C)] 99 F (37.2 C) (05/27 0645) Pulse Rate:  [58-64] 58 (05/27 0645) Resp:  [18] 18 (05/27 0645) BP: (89-118)/(37-72) 94/37 (05/27 0645) SpO2:  [94 %-99 %] 95 % (05/27  0645) Weight:  [171 lb 8 oz (77.8 kg)] 171 lb 8 oz (77.8 kg) (05/27 1610) Physical Exam: GEN: Lying in bed, sleepy but arises easily to his name and responds appropriately to questions Head: normocephalic and atraumatic  Eyes: conjunctiva without injection, sclera anicteric. PERRL. EOMI CVS: RRR, nl s1 & s2, no murmurs, no edema RESP: no IWOB, good air movement bilaterally, CTAB GI: BS present & normal, soft, NTND GU: no suprapubic or CVA tenderness MSK: no focal tenderness or notable swelling. Right knee with old surgical scar and some tenderness to palpation over the medial aspect. No overlying skin erythema or increased warmth to touch. No significant pain with manipulation.  NEURO: Sleepy but arises easily. Oriented to self, month, year, place and person but not to date. Motor 4/5 in upper and lower extremities bilaterally. Cranial nerve exam within normal limits.  PSYCH: euthymic mood with congruent affect Laboratory:  Recent Labs Lab 12/20/16 1133 12/21/16 0413  WBC 4.4 4.3  HGB 11.2* 10.1*  HCT 40.2 34.8*  PLT 112* 91*    Recent Labs Lab 12/20/16 1133  12/20/16 2322 12/21/16 0413 12/21/16 0624  NA 170*  < > 163* 164* 165*  K 4.3  < > 3.4* 3.9 3.9  CL >130*  < > >130* >130* >130*  CO2 26  < > 24 27 24   BUN 33*  < > 28* 27* 25*  CREATININE 1.78*  < > 1.69* 1.71* 1.66*  CALCIUM 9.7  < > 8.8* 8.8* 8.9  PROT 6.6  --   --  5.8*  --   BILITOT 0.5  --   --  0.1*  --   ALKPHOS 140*  --   --  120  --   ALT 18  --   --  16*  --   AST 29  --   --  23  --   GLUCOSE 94  < > 118* 106* 99  < > = values in this interval not displayed.  Imaging/Diagnostic Tests: Dg Chest Port 1 View  Result Date: 12/21/2016 CLINICAL DATA:  Fall 2 days ago. EXAM: PORTABLE CHEST 1 VIEW COMPARISON:  None. FINDINGS: The heart is borderline. A pacemaker is in good position. The hila and mediastinum are normal. Scar or atelectasis is seen in the left base. No pulmonary nodules or masses. No focal  infiltrates. No obvious fractures on this single frontal view of the chest. IMPRESSION: No active disease. Electronically Signed   By: Gerome Sam III M.D   On: 12/21/2016 10:06    Almon Hercules, MD 12/21/2016, 12:27 PM PGY-2, [facility] FPTS Intern pager: [phone], text pages welcome

## 2016-12-22 LAB — BASIC METABOLIC PANEL
ANION GAP: 2 — AB (ref 5–15)
Anion gap: 3 — ABNORMAL LOW (ref 5–15)
Anion gap: 4 — ABNORMAL LOW (ref 5–15)
Anion gap: 5 (ref 5–15)
BUN: 22 mg/dL — ABNORMAL HIGH (ref 6–20)
BUN: 20 mg/dL (ref 6–20)
BUN: 21 mg/dL — AB (ref 6–20)
BUN: 22 mg/dL — ABNORMAL HIGH (ref 6–20)
CALCIUM: 8.8 mg/dL — AB (ref 8.9–10.3)
CALCIUM: 9 mg/dL (ref 8.9–10.3)
CALCIUM: 9.1 mg/dL (ref 8.9–10.3)
CALCIUM: 8.8 mg/dL — AB (ref 8.9–10.3)
CO2: 27 mmol/L (ref 22–32)
CO2: 25 mmol/L (ref 22–32)
CO2: 25 mmol/L (ref 22–32)
CO2: 25 mmol/L (ref 22–32)
CREATININE: 1.53 mg/dL — AB (ref 0.61–1.24)
CREATININE: 1.56 mg/dL — AB (ref 0.61–1.24)
CREATININE: 1.61 mg/dL — AB (ref 0.61–1.24)
Chloride: 126 mmol/L — ABNORMAL HIGH (ref 101–111)
Chloride: 125 mmol/L — ABNORMAL HIGH (ref 101–111)
Chloride: 125 mmol/L — ABNORMAL HIGH (ref 101–111)
Chloride: 121 mmol/L — ABNORMAL HIGH (ref 101–111)
Creatinine, Ser: 1.63 mg/dL — ABNORMAL HIGH (ref 0.61–1.24)
GFR calc Af Amer: 54 mL/min — ABNORMAL LOW (ref >60)
GFR calc Af Amer: 51 mL/min — ABNORMAL LOW (ref >60)
GFR calc Af Amer: 55 mL/min — ABNORMAL LOW (ref >60)
GFR, EST AFRICAN AMERICAN: 52 mL/min — AB (ref >60)
GFR, EST NON AFRICAN AMERICAN: 44 mL/min — AB (ref >60)
GFR, EST NON AFRICAN AMERICAN: 47 mL/min — AB (ref >60)
GFR, EST NON AFRICAN AMERICAN: 48 mL/min — AB (ref >60)
GFR, EST NON AFRICAN AMERICAN: 45 mL/min — AB (ref >60)
GLUCOSE: 124 mg/dL — AB (ref 65–99)
GLUCOSE: 127 mg/dL — AB (ref 65–99)
GLUCOSE: 129 mg/dL — AB (ref 65–99)
Glucose, Bld: 108 mg/dL — ABNORMAL HIGH (ref 65–99)
POTASSIUM: 3.7 mmol/L (ref 3.5–5.1)
POTASSIUM: 3.4 mmol/L — AB (ref 3.5–5.1)
Potassium: 3.5 mmol/L (ref 3.5–5.1)
Potassium: 4 mmol/L (ref 3.5–5.1)
SODIUM: 155 mmol/L — AB (ref 135–145)
SODIUM: 154 mmol/L — AB (ref 135–145)
SODIUM: 151 mmol/L — AB (ref 135–145)
Sodium: 153 mmol/L — ABNORMAL HIGH (ref 135–145)

## 2016-12-22 LAB — CBC
HCT: 34.7 % — ABNORMAL LOW (ref 39.0–52.0)
HEMOGLOBIN: 10.3 g/dL — AB (ref 13.0–17.0)
MCH: 27.8 pg (ref 26.0–34.0)
MCHC: 29.7 g/dL — AB (ref 30.0–36.0)
MCV: 93.5 fL (ref 78.0–100.0)
PLATELETS: 94 10*3/uL — AB (ref 150–400)
RBC: 3.71 MIL/uL — AB (ref 4.22–5.81)
RDW: 14.8 % (ref 11.5–15.5)
WBC: 4.6 10*3/uL (ref 4.0–10.5)

## 2016-12-22 LAB — ACTH STIMULATION, 3 TIME POINTS
CORTISOL BASE: 7.2 ug/dL
Cortisol, 30 Min: 15.4 ug/dL
Cortisol, 60 Min: 19.8 ug/dL

## 2016-12-22 NOTE — Progress Notes (Signed)
Physical Therapy Treatment Patient Details Name: Samuel Gallagher MRN: [id number] DOB: Jul 17, 1956 Today's Date: 12/22/2016    History of Present Illness Samuel Gallagher is a 61 y.o. male presenting with fatigue found to be severely hypernatremic. PMH is significant for diabetes insipidus, gout, pacemaker placement 8 years ago    PT Comments    Pt progressing towards goals. Pt refusing ambulation secondary to meal tray coming in during session, but agreeable to transfer to chair. Continuing to demonstrate ataxia and instability during mobility requiring min A for stability. Practiced fine motor grasp and UE movement during feeding for improving ataxic movements.  Reviewed HEP for pt to perform throughout day to maintain mobility; will need to follow up with pt. Current recommendations appropriate. Will continue to follow to maximize functional mobility independence.   Follow Up Recommendations  SNF (short term )     Equipment Recommendations  None recommended by PT    Recommendations for Other Services OT consult;Other (comment) (consider swallow screen )     Precautions / Restrictions Precautions Precautions: Fall Restrictions Weight Bearing Restrictions: No    Mobility  Bed Mobility Overal bed mobility: Needs Assistance Bed Mobility: Supine to Sit     Supine to sit: Supervision     General bed mobility comments: Supervision for safety   Transfers Overall transfer level: Needs assistance Equipment used: Rolling walker (2 wheeled) Transfers: Sit to/from [employer]GI CorporationStand;Stand Pivot Transfers Sit to Stand: Min assist Stand pivot transfers: Min assist       General transfer comment: Min A for steadying once standing and throughout transfer secondary to ataxia and instability. Verbal cues for appropriate hand placement and use of RW throughout transfer. Safety cues to wait for PT to perform task.   Ambulation/Gait             General Gait Details: Pt refusing stating he  wanted to eat his meal.    Stairs            Wheelchair Mobility    Modified Rankin (Stroke Patients Only)       Balance Overall balance assessment: Needs assistance;History of Falls Sitting-balance support: No upper extremity supported;Feet supported Sitting balance-Leahy Scale: Good     Standing balance support: Bilateral upper extremity supported;No upper extremity supported Standing balance-Leahy Scale: Poor Standing balance comment: Reliant on RW for stability                             Cognition Arousal/Alertness: Awake/alert Behavior During Therapy: WFL for tasks assessed/performed Overall Cognitive Status: Within Functional Limits for tasks assessed                                        Exercises General Exercises - Upper Extremity Shoulder Flexion: AROM;Both;Other reps (comment);Seated (2; with deep breathing ) General Exercises - Lower Extremity Ankle Circles/Pumps: AROM;Both;Other reps (comment);Seated (2) Long Arc Quad: AROM;Both;Other reps (comment);Seated (2) Hip ABduction/ADduction: AROM;Both;Other reps (comment);Seated (2) Hip Flexion/Marching: AROM;Both;Other reps (comment);Seated (2) Other Exercises Other Exercises: Practiced fine motor control with R hand during feeding. Noted ataxia with elbow flexion when bringing spoon to mouth. Educated and practiced increasing grip with spoon when feeding. Practiced X 5 during session     General Comments General comments (skin integrity, edema, etc.): Pt requesting to eat during session, so discussed with demonstration HEP to perform at home. Had pt practice a couple  reps of each exercise.       Pertinent Vitals/Pain Pain Assessment: No/denies pain    Home Living Family/patient expects to be discharged to:: Private residence                    Prior Function            PT Goals (current goals can now be found in the care plan section) Acute Rehab PT  Goals Patient Stated Goal: Feel better PT Goal Formulation: With patient Time For Goal Achievement: 01/04/17 Potential to Achieve Goals: Good Progress towards PT goals: Progressing toward goals    Frequency    Min 2X/week      PT Plan Frequency needs to be updated    Co-evaluation              AM-PAC PT "6 Clicks" Daily Activity  Outcome Measure  Difficulty turning over in bed (including adjusting bedclothes, sheets and blankets)?: None Difficulty moving from lying on back to sitting on the side of the bed? : A Little Difficulty sitting down on and standing up from a chair with arms (e.g., wheelchair, bedside commode, etc,.)?: A Little Help needed moving to and from a bed to chair (including a wheelchair)?: A Little Help needed walking in hospital room?: A Lot Help needed climbing 3-5 steps with a railing? : A Lot 6 Click Score: 17    End of Session Equipment Utilized During Treatment: Gait belt Activity Tolerance: Patient tolerated treatment well Patient left: in chair;with call bell/phone within reach;with chair alarm set Nurse Communication: Mobility status PT Visit Diagnosis: Unsteadiness on feet (R26.81);Muscle weakness (generalized) (M62.81);Ataxic gait (R26.0)     Time: 1610-9604 PT Time Calculation (min) (ACUTE ONLY): 12 min  Charges:  $Therapeutic Activity: 8-22 mins                    G Codes:       Margot Chimes, PT, DPT  Acute Rehabilitation Services  Pager: [phone]    Melvyn Novas 12/22/2016, 2:13 PM

## 2016-12-22 NOTE — Progress Notes (Signed)
Family Medicine Teaching Service Daily Progress Note Intern Pager: [phone]  Patient name: Samuel Gallagher Medical record number: [id number] Date of birth: 11-16-1955 Age: 61 y.o. Gender: male  Primary Care Provider: System, Pcp Not In Consultants: nephrology over the phone on admission Code Status: Full  Pt Overview and Major Events to Date:  05/26-admitted with hyponatremia to 170  Assessment and Plan: Samuel Gallagher is a 61 y.o. male presenting with fatigue found to be severely hypernatremic. PMH is significant for diabetes insipidus, gout, pacemaker placement 8 years ago  Hypernatremia/Hypercholoredimia:  history of diabetes insipidus on desmopressin at home that he didn'Gallagher take in the last 3-4 days. However, labs (urine osmolality >600-800 and urine Na to 229) suggest mineral corticosteroid use versus diabetes insipidus but patient denies history of steroid use. His a.m. cortisol is low to 5.1 this morning. He is also hypotensive to 90/30's. Sodium elevated to 170s on admission and trended to 153 after D5W. Initial water deficit about 10 L. - Restarted home desmopressin 5/26 - Na 153. Free water deficit 4.6 L. Need D5W@140ml /hr to correct his sodium at the rate of 10 mEq/24 hours over the next 32 hours. However, he is on desmopressin & take oral fluid which could accelerate his Na correction so will resume D5W@100cc /hr to be on safe side. - BMET q6 hrs   Hypotension: BP 91/49 this morning. Baseline SBP in 90s to 100s per patient. AM cortisol low to 5.1. Denies history of adrenal insufficiency or steroid use in the past - f/u ACTH and morning ACTH stimulation test - May need steroid  Fatigue/Fall: presyncopal falls most concerning for orthostatic hypotension in the setting of a polyuria and diabetes insipidus. Patient with history of sick sinus syndrome on pacemaker for the last 8 years. CT head negative for acute findings, normal neurologic exam. EKG with paced sinus rhythm. Trop neg  x3. HIV, TSH, vitamin B12, folate, EtOH and UDS within normal limits.  - Follow up RPR - PT/ OT recommended SNF. Patient okay with this. SW consulted  History of seizure disorder: this is remote per patient's report. He says he is not on any medication. Wonder if this could be due to hypernatremia. -monitor  Lower Back/Knee pain 2/2 most recent fall: Complains right knee pain. Exam within normal limits except for the previous surgical scar and some tenderness to palpation over the medial aspect. Lumbar spine x-ray and right knee x-ray without acute findings - Tylenol as needed for pain   - Ice pack  AKI: Scr 1.61, 1.78 on admission. UA without signs of ATN or infection.   - Continue to follow  - Continue fluid - If no significant improvement consider renal ultrasound and FENa   Normocytic Anemia: Per patient hx of colonoscopy 5 years that was completely normal. No overt signs of bleeding. Iron panel within normal except for mildly low TIBC favoring ACD. Retic 0.8, corrects to 0.6 with a RI of 0.43 suggestive for hypo-proliferation. He also have thrombocytopenia 91.  - Follow up FOBT   Thrombocytopenia: Stable. Platelets 94, 112 on admission - Continue daily cbc - Discontinued Lovenox. SCD  FEN/GI: Regular diet   Prophylaxis: SCD given thrombocytopenia  Disposition:  continuing inpatient management pending clinical improvement.  Subjective:  Patient reports feeling better. Sitting on his bed and eating breakfast. He complains about the left knee pain secondary to fall. He denies chest pain or shortness of breath.   Objective: Temp:  [98.2 F (36.8 C)-98.8 F (37.1 C)] 98.7 F (37.1 C) (05/28  [id number]) Pulse Rate:  [59-62] 59 (05/28 0552) Resp:  [18-20] 18 (05/28 0552) BP: (91-106)/(39-49) 91/49 (05/28 0552) SpO2:  [100 %] 100 % (05/28 0552) Weight:  [183 lb 6.4 oz (83.2 kg)] 183 lb 6.4 oz (83.2 kg) (05/28 0552) Physical Exam: GEN: Sitting in bed eating breakfast Head:  normocephalic and atraumatic  Eyes: conjunctiva without injection, sclera anicteric. PERRL. EOMI CVS: RRR, nl s1 & s2, no murmurs, no edema RESP: no IWOB, good air movement bilaterally, CTAB GI: BS present & normal, soft, NTND GU: no suprapubic or CVA tenderness MSK: no focal tenderness or notable swelling. Right knee with old surgical scar and some tenderness to palpation over the medial aspect. No overlying skin erythema or increased warmth to touch. No significant pain with manipulation.  NEURO: Sitting in bed eating breakfast, AAOx3,  Motor 4/5 in upper and lower extremities bilaterally. Cranial nerve exam within normal limits.  PSYCH: euthymic mood with congruent affect Laboratory:  Recent Labs Lab 12/20/16 1133 12/21/16 0413  WBC 4.4 4.3  HGB 11.2* 10.1*  HCT 40.2 34.8*  PLT 112* 91*    Recent Labs Lab 12/20/16 1133  12/21/16 0413  12/21/16 1421 12/21/16 2017 12/22/16 0052  NA 170*  < > 164*  < > 158* 152* 153*  K 4.3  < > 3.9  < > 3.4* 3.4* 3.5  CL >130*  < > >130*  < > 130* 123* 126*  CO2 26  < > 27  < > 26 26 25   BUN 33*  < > 27*  < > 20 20 22*  CREATININE 1.78*  < > 1.71*  < > 1.54* 1.67* 1.61*  CALCIUM 9.7  < > 8.8*  < > 8.9 8.6* 8.8*  PROT 6.6  --  5.8*  --   --   --   --   BILITOT 0.5  --  0.1*  --   --   --   --   ALKPHOS 140*  --  120  --   --   --   --   ALT 18  --  16*  --   --   --   --   AST 29  --  23  --   --   --   --   GLUCOSE 94  < > 106*  < > 102* 104* 108*  < > = values in this interval not displayed.  Imaging/Diagnostic Tests: Dg Chest Port 1 View  Result Date: 12/21/2016 CLINICAL DATA:  Fall 2 days ago. EXAM: PORTABLE CHEST 1 VIEW COMPARISON:  None. FINDINGS: The heart is borderline. A pacemaker is in good position. The hila and mediastinum are normal. Scar or atelectasis is seen in the left base. No pulmonary nodules or masses. No focal infiltrates. No obvious fractures on this single frontal view of the chest. IMPRESSION: No active disease.  Electronically Signed   By: Samuel Samavid  Williams Gallagher M.D   On: 12/21/2016 10:06    Samuel Gallagher, Samuel Donze Samuel Gallagher 12/22/2016, 7:03 AM PGY-2, [facility] FPTS Intern pager: [phone], text pages welcome

## 2016-12-23 ENCOUNTER — Encounter (HOSPITAL_COMMUNITY): Payer: Self-pay | Admitting: *Deleted

## 2016-12-23 DIAGNOSIS — E870 Hyperosmolality and hypernatremia: Principal | ICD-10-CM

## 2016-12-23 DIAGNOSIS — W19XXXA Unspecified fall, initial encounter: Secondary | ICD-10-CM

## 2016-12-23 DIAGNOSIS — E878 Other disorders of electrolyte and fluid balance, not elsewhere classified: Secondary | ICD-10-CM

## 2016-12-23 LAB — BASIC METABOLIC PANEL
ANION GAP: 4 — AB (ref 5–15)
ANION GAP: 4 — AB (ref 5–15)
Anion gap: 5 (ref 5–15)
BUN: 21 mg/dL — ABNORMAL HIGH (ref 6–20)
BUN: 20 mg/dL (ref 6–20)
BUN: 17 mg/dL (ref 6–20)
CALCIUM: 8.8 mg/dL — AB (ref 8.9–10.3)
CO2: 24 mmol/L (ref 22–32)
CO2: 23 mmol/L (ref 22–32)
CO2: 24 mmol/L (ref 22–32)
CREATININE: 1.44 mg/dL — AB (ref 0.61–1.24)
Calcium: 8.7 mg/dL — ABNORMAL LOW (ref 8.9–10.3)
Calcium: 8.8 mg/dL — ABNORMAL LOW (ref 8.9–10.3)
Chloride: 118 mmol/L — ABNORMAL HIGH (ref 101–111)
Chloride: 119 mmol/L — ABNORMAL HIGH (ref 101–111)
Chloride: 116 mmol/L — ABNORMAL HIGH (ref 101–111)
Creatinine, Ser: 1.35 mg/dL — ABNORMAL HIGH (ref 0.61–1.24)
Creatinine, Ser: 1.34 mg/dL — ABNORMAL HIGH (ref 0.61–1.24)
GFR calc Af Amer: >60 mL/min (ref >60)
GFR calc Af Amer: >60 mL/min (ref >60)
GFR, EST AFRICAN AMERICAN: 60 mL/min — AB (ref >60)
GFR, EST NON AFRICAN AMERICAN: 51 mL/min — AB (ref >60)
GFR, EST NON AFRICAN AMERICAN: 56 mL/min — AB (ref >60)
GFR, EST NON AFRICAN AMERICAN: 56 mL/min — AB (ref >60)
Glucose, Bld: 115 mg/dL — ABNORMAL HIGH (ref 65–99)
Glucose, Bld: 114 mg/dL — ABNORMAL HIGH (ref 65–99)
Glucose, Bld: 114 mg/dL — ABNORMAL HIGH (ref 65–99)
POTASSIUM: 3.3 mmol/L — AB (ref 3.5–5.1)
POTASSIUM: 3.9 mmol/L (ref 3.5–5.1)
Potassium: 3.2 mmol/L — ABNORMAL LOW (ref 3.5–5.1)
SODIUM: 147 mmol/L — AB (ref 135–145)
SODIUM: 146 mmol/L — AB (ref 135–145)
SODIUM: 144 mmol/L (ref 135–145)

## 2016-12-23 LAB — RPR: RPR Ser Ql: NONREACTIVE

## 2016-12-23 LAB — CBC
HCT: 31.6 % — ABNORMAL LOW (ref 39.0–52.0)
Hemoglobin: 9.8 g/dL — ABNORMAL LOW (ref 13.0–17.0)
MCH: 28.7 pg (ref 26.0–34.0)
MCHC: 31 g/dL (ref 30.0–36.0)
MCV: 92.4 fL (ref 78.0–100.0)
PLATELETS: 95 10*3/uL — AB (ref 150–400)
RBC: 3.42 MIL/uL — ABNORMAL LOW (ref 4.22–5.81)
RDW: 15 % (ref 11.5–15.5)
WBC: 5 10*3/uL (ref 4.0–10.5)

## 2016-12-23 LAB — ACTH: C206 ACTH: 74.6 pg/mL — AB (ref 7.2–63.3)

## 2016-12-23 LAB — HAPTOGLOBIN: HAPTOGLOBIN: 23 mg/dL — AB (ref 34–200)

## 2016-12-23 MED ORDER — POTASSIUM CHLORIDE CRYS ER 20 MEQ PO TBCR
40.0000 meq | EXTENDED_RELEASE_TABLET | Freq: Two times a day (BID) | ORAL | Status: DC
  Administered 2016-12-23: 40 meq via ORAL
  Filled 2016-12-23: qty 2

## 2016-12-23 MED ORDER — POTASSIUM CHLORIDE CRYS ER 20 MEQ PO TBCR
40.0000 meq | EXTENDED_RELEASE_TABLET | ORAL | Status: AC
  Administered 2016-12-23 (×2): 40 meq via ORAL
  Filled 2016-12-23 (×2): qty 2

## 2016-12-23 NOTE — Progress Notes (Signed)
Family Medicine Teaching Service Daily Progress Note Intern Pager: [phone]  Patient name: Samuel Gallagher Medical record number: [id number] Date of birth: March 02, 1956 Age: 61 y.o. Gender: male  Primary Care Provider: System, Pcp Not In Consultants: nephrology over the phone on admission Code Status: Full  Pt Overview and Major Events to Date:  05/26-admitted with hyponatremia to 170  Assessment and Plan: Samuel Gallagher is a 61 y.o. male presenting with fatigue found to be severely hypernatremic. 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 There has been concern for adrenal insufficiency with a cosyntropin test with response consistent low ACTH which would be consistent with possible HPI axis pathology. This morning NA+ was 147 with have been correcting hypernatremia at a slow rate. Patient is also on D5 IVF for volume deficit correction with an initial calculated water deficit of 10L. Patientcomplaining of global HA 9/10 since this morning, slight concern for cerebral edema though unlikley given slow correction  Of hypernatremia. Patient had a fall and hit his head. Patient H/A could secondary to fall. Neuro exam is normal. Would consider furhther imaging if patient worsens clinically. --Continue desmopressin 0.01% s spray bid --Continue D5W @75cc /hr from 100 cc/hr given resolving hypernatremia and better po intake --Follow up on BMP and replete electrolyte as needed --Consider MRI head with worsening HA  #Hypotension, resolving, stable BP 91/49 this morning. Baseline SBP in 90s to 100s per patient. This morning 11/62 will continue to monitor. Concern for possible adrenal deficiency with response to cosyntropin stimulation test  suggested so. --Will follow up on ACTH level --Will hold off on steroid  #Fatigue/Fall presyncopal falls most concerning for orthostatic hypotension in the setting of a polyuria and diabetes insipidus. Patient with history of sick sinus syndrome on pacemaker for the last 8 years. CT head negative for acute findings, normal neurologic exam. EKG with paced sinus rhythm. Trop neg x3. HIV, TSH, vitamin B12, folate, EtOH and UDS within normal limits.RPR was non reactive.  --PT/ OT recommended SNF. Patient okay with this. SW consulted  #History of seizure disorder Not on meds, could be secondary to hypernatremia. --Will continue to monitor  #Lower Back/Knee pain 2/2 most recent fall Complains right knee pain. Exam within normal limits except for the previous surgical scar and some tenderness to palpation over the medial aspect. Lumbar spine x-ray and right knee x-ray without acute findings --Tylenol as needed for pain   --Ice pack  #AKI Scr 1.35 this am, 1.78 on admission. UA without signs of ATN or infection.   --Continue to follow  --Continue IVF  #Normocytic Anemia Per patient hx of colonoscopy 5 years that was completely normal. No overt signs of bleeding. Iron panel within normal except for mildly low TIBC favoring ACD. Retic 0.8, corrects to 0.6 with a RI of 0.43 suggestive for hypo-proliferation. He also have thrombocytopenia 91.  - Follow up FOBT   #Thrombocytopenia, Stable. Platelets 94, 112 on admission --Follow up on am CBC --Discontinued Lovenox and start SCD   FEN/GI: Regular diet  Prophylaxis: SCD given thrombocytopenia   Disposition: Continuing inpatient management pending clinical improvement.  Subjective:  Patient is complaining of HA this morning, but otherwise improving. Good apettite and no other complaints.  Objective: Temp:  [98.4 F (36.9 C)-98.6 F (37 C)] 98.4 F (36.9 C) (05/29  [id number]) Pulse Rate:  [58-60] 58 (05/29 0538) Resp:  [16-18] 18 (05/29  0538) BP: (95-113)/(38-54) 95/54 (05/29 0538) SpO2:  [98 %-100 %] 100 % (05/29 0538)   Physical Exam: GEN: Sitting in bed eating breakfast Head: normocephalic and atraumatic  Eyes: conjunctiva without injection, sclera anicteric. PERRL. EOMI CVS: RRR, nl s1 & s2, no murmurs, no edema RESP: no IWOB, good air movement bilaterally, CTAB GI: BS present & normal, soft, NTND GU: no suprapubic or CVA tenderness MSK: no focal tenderness or notable swelling. Right knee with old surgical scar and some tenderness to palpation over the medial aspect. No overlying skin erythema or increased warmth to touch. No significant pain with manipulation.  NEURO: Sitting in bed eating breakfast, AAOx3,  Motor 4/5 in upper and lower extremities bilaterally. Cranial nerve exam within normal limits.  PSYCH: euthymic mood with congruent affect  Laboratory:  Recent Labs Lab 12/21/16 0413 12/22/16 0841 12/23/16 0314  WBC 4.3 4.6 5.0  HGB 10.1* 10.3* 9.8*  HCT 34.8* 34.7* 31.6*  PLT 91* 94* 95*    Recent Labs Lab 12/20/16 1133  12/21/16 0413  12/22/16 1454 12/22/16 2123 12/23/16 0314  NA 170*  < > 164*  < > 154* 151* 147*  K 4.3  < > 3.9  < > 4.0 3.4* 3.2*  CL >130*  < > >130*  < > 125* 121* 118*  CO2 26  < > 27  < > 25 25 24   BUN 33*  < > 27*  < > 21* 22* 21*  CREATININE 1.78*  < > 1.71*  < > 1.63* 1.53* 1.44*  CALCIUM 9.7  < > 8.8*  < > 9.1 8.8* 8.8*  PROT 6.6  --  5.8*  --   --   --   --   BILITOT 0.5  --  0.1*  --   --   --   --   ALKPHOS 140*  --  120  --   --   --   --   ALT 18  --  16*  --   --   --   --   AST 29  --  23  --   --   --   --   GLUCOSE 94  < > 106*  < > 127* 129* 115*  < > = values in this interval not displayed.  Imaging/Diagnostic Tests: Dg Chest Port 1 View  Result Date: 12/21/2016 CLINICAL DATA:  Fall 2 days ago. EXAM: PORTABLE CHEST 1 VIEW COMPARISON:  IMPRESSION: No active disease. Electronically Signed   By: Gerome Samavid  Williams III M.D   On: 12/21/2016 10:06     Lovena Neighboursiallo, Ashland Osmer, MD 12/23/2016, 7:05 AM PGY-1, [facility] FPTS Intern pager: [phone], text pages welcome

## 2016-12-23 NOTE — [place] FL2 (Signed)
[place] MEDICAID FL2 LEVEL OF CARE SCREENING TOOL     IDENTIFICATION  Patient Name: Samuel Gallagher Birthdate: 11-23-1955 Sex: male Admission Date (Current Location): 12/20/2016  [facility] [place]   Facility and Address:  [facility], [address], [place], [place] [address]      Provider Number: [id number]  Attending Physician Name and Address:  Uvaldo RisingFletke, Kyle J, MD  Relative Name and Phone Number:  Samuel BeathMelvin, Brother, [phone]    Current Level of Care: Hospital Recommended Level of Care: Skilled Nursing Facility Prior Approval Number:    Date Approved/Denied:   PASRR Number: [id number]  Discharge Plan: SNF    Current Diagnoses: Patient Active Problem List   Diagnosis Date Noted  . Hypernatremia 12/20/2016  . AKI (acute kidney injury) (HCC) 12/20/2016    Orientation RESPIRATION BLADDER Height & Weight     Self, Time, Situation, Place  Normal Continent Weight: 83.2 kg (183 lb 6.4 oz) Height:  5\' 7"  (170.2 cm)  BEHAVIORAL SYMPTOMS/MOOD NEUROLOGICAL BOWEL NUTRITION STATUS      Continent Diet (Please see DC Summary)  AMBULATORY STATUS COMMUNICATION OF NEEDS Skin   Limited Assist Verbally Normal                       Personal Care Assistance Level of Assistance  Bathing, Feeding, Dressing Bathing Assistance: Limited assistance Feeding assistance: Independent Dressing Assistance: Limited assistance     Functional Limitations Info             SPECIAL CARE FACTORS FREQUENCY  PT (By licensed PT)     PT Frequency: 5x/week              Contractures      Additional Factors Info  Code Status, Allergies Code Status Info: Full Allergies Info: NKA           Current Medications (12/23/2016):  This is the current hospital active medication list Current Facility-Administered Medications  Medication Dose Route Frequency Provider Last Rate Last Dose  . acetaminophen (TYLENOL) tablet 650 mg  650 mg  Oral Q6H PRN Berton BonMikell, Asiyah Zahra, MD   650 mg at 12/23/16 [id number]   Or  . acetaminophen (TYLENOL) suppository 650 mg  650 mg Rectal Q6H PRN Berton BonMikell, Asiyah Zahra, MD      . allopurinol (ZYLOPRIM) tablet 100 mg  100 mg Oral Daily Berton BonMikell, Asiyah Zahra, MD   100 mg at 12/23/16 0926  . desmopressin (DDAVP) 0.01 % (10 mcg/activation) spray 1 spray  1 spray Nasal BID Berton BonMikell, Asiyah Zahra, MD   1 spray at 12/23/16 0927  . dextrose 5 % solution   Intravenous Continuous Berton BonMikell, Asiyah Zahra, MD 100 mL/hr at 12/23/16 1539    . ondansetron (ZOFRAN) tablet 4 mg  4 mg Oral Q6H PRN Mikell, Antionette PolesAsiyah Zahra, MD       Or  . ondansetron (ZOFRAN) injection 4 mg  4 mg Intravenous Q6H PRN Mikell, Antionette PolesAsiyah Zahra, MD      . senna-docusate (Senokot-S) tablet 1 tablet  1 tablet Oral QHS PRN Mikell, Antionette PolesAsiyah Zahra, MD      . sodium chloride flush (NS) 0.9 % injection 3 mL  3 mL Intravenous Q12H Berton BonMikell, Asiyah Zahra, MD   3 mL at 12/22/16 2208     Discharge Medications: Please see discharge summary for a list of discharge medications.  Relevant Imaging Results:  Relevant Lab Results:   Additional Information SSN: [id number]  Anvika Gashi S AlphaRayyan,  LCSWA

## 2016-12-23 NOTE — Progress Notes (Signed)
Occupational Therapy Treatment Patient Details Name: Samuel Gallagher MRN: [id number] DOB: [birth year] Today's Date: 12/23/2016    History of present illness Samuel Gallagher is a 61 y.o. male presenting with fatigue found to be severely hypernatremic. PMH is significant for diabetes insipidus, gout, pacemaker placement 8 years ago   OT comments  Pt continues to demonstrate impaired cognition and decreased awareness of deficits. Pt with unsteady gait. Performed toileting and one activity in standing. R UE with impaired coordination, pt having difficulty with opening containers of meal tray and managing his cell phone. Continue to recommend SNF for ST rehab. Will continue to follow.  Follow Up Recommendations  SNF;Supervision/Assistance - 24 hour    Equipment Recommendations       Recommendations for Other Services      Precautions / Restrictions Precautions Precautions: Fall       Mobility Bed Mobility   Bed Mobility: Supine to Sit;Sit to Supine     Supine to sit: Supervision Sit to supine: Supervision   General bed mobility comments: Supervision for safety, pt without regard for lines.  Transfers Overall transfer level: Needs assistance Equipment used: Rolling walker (2 wheeled) Transfers: Sit to/from Stand Sit to Stand: Min assist         General transfer comment: cues for hand placement, min guard for safety    Balance Overall balance assessment: Needs assistance;History of Falls Sitting-balance support: No upper extremity supported;Feet supported Sitting balance-Leahy Scale: Good       Standing balance-Leahy Scale: Poor Standing balance comment: Reliant on RW for stability                            ADL either performed or assessed with clinical judgement   ADL Overall ADL's : Needs assistance/impaired Eating/Feeding: Set up;Sitting Eating/Feeding Details (indicate cue type and reason): assist to open jello Grooming: Wash/dry face;Sitting;Set  up                   Toilet Transfer: Minimal assistance;Ambulation;RW   Toileting- Architect and Hygiene: Min guard;Sit to/from stand       Functional mobility during ADLs: Minimal assistance;Rolling walker General ADL Comments: Pt smelling of urine, told him, but he refused bathing and to sit on clean linens of chair.     Vision   Vision Assessment?: No apparent visual deficits   Perception     Praxis      Cognition Arousal/Alertness: Awake/alert Behavior During Therapy: WFL for tasks assessed/performed Overall Cognitive Status: Impaired/Different from baseline Area of Impairment: Safety/judgement;Memory;Problem solving                     Memory: Decreased short-term memory   Safety/Judgement: Decreased awareness of safety;Decreased awareness of deficits   Problem Solving: Slow processing;Decreased initiation;Difficulty sequencing;Requires verbal cues General Comments: pt with difficulty answering/placing call with his cell phone        Exercises Other Exercises Other Exercises: Pt with difficulty managing phone, papers, gown with R hand.   Shoulder Instructions       General Comments      Pertinent Vitals/ Pain       Pain Assessment: No/denies pain  Home Living                                          Prior Functioning/Environment  Frequency  Min 2X/week        Progress Toward Goals  OT Goals(current goals can now be found in the care plan section)  Progress towards OT goals: Not progressing toward goals - comment (pt with impaired cognition, just wanting to take a nap)  Acute Rehab OT Goals Patient Stated Goal: Feel better OT Goal Formulation: With patient Time For Goal Achievement: 01/04/17 Potential to Achieve Goals: Good  Plan Discharge plan remains appropriate    Co-evaluation                 AM-PAC PT "6 Clicks" Daily Activity     Outcome Measure   Help from  another person eating meals?: A Little Help from another person taking care of personal grooming?: A Little Help from another person toileting, which includes using toliet, bedpan, or urinal?: A Little Help from another person bathing (including washing, rinsing, drying)?: A Little Help from another person to put on and taking off regular upper body clothing?: A Little Help from another person to put on and taking off regular lower body clothing?: A Little 6 Click Score: 18    End of Session Equipment Utilized During Treatment: Gait belt;Rolling walker  OT Visit Diagnosis: Other abnormalities of gait and mobility (R26.89);Muscle weakness (generalized) (M62.81);Other symptoms and signs involving cognitive function;History of falling (Z91.81)   Activity Tolerance Patient limited by fatigue (pt wanting to sleep)   Patient Left in bed;with call bell/phone within reach;with bed alarm set   Nurse Communication          Time: [id number] OT Time Calculation (min): 33 min  Charges: OT General Charges $OT Visit: 1 Procedure OT Treatments $Self Care/Home Management : 23-37 mins    Evern BioMayberry, Nehal Shives Lynn 12/23/2016, 2:21 PM  [phone]

## 2016-12-24 LAB — BASIC METABOLIC PANEL
ANION GAP: 4 — AB (ref 5–15)
ANION GAP: 4 — AB (ref 5–15)
Anion gap: 4 — ABNORMAL LOW (ref 5–15)
BUN: 16 mg/dL (ref 6–20)
BUN: 16 mg/dL (ref 6–20)
BUN: 15 mg/dL (ref 6–20)
CALCIUM: 8.6 mg/dL — AB (ref 8.9–10.3)
CALCIUM: 9.1 mg/dL (ref 8.9–10.3)
CO2: 24 mmol/L (ref 22–32)
CO2: 24 mmol/L (ref 22–32)
CO2: 26 mmol/L (ref 22–32)
CREATININE: 1.34 mg/dL — AB (ref 0.61–1.24)
Calcium: 8.6 mg/dL — ABNORMAL LOW (ref 8.9–10.3)
Chloride: 113 mmol/L — ABNORMAL HIGH (ref 101–111)
Chloride: 114 mmol/L — ABNORMAL HIGH (ref 101–111)
Chloride: 115 mmol/L — ABNORMAL HIGH (ref 101–111)
Creatinine, Ser: 1.38 mg/dL — ABNORMAL HIGH (ref 0.61–1.24)
Creatinine, Ser: 1.39 mg/dL — ABNORMAL HIGH (ref 0.61–1.24)
GFR calc Af Amer: >60 mL/min (ref >60)
GFR calc Af Amer: >60 mL/min (ref >60)
GFR, EST NON AFRICAN AMERICAN: 56 mL/min — AB (ref >60)
GFR, EST NON AFRICAN AMERICAN: 54 mL/min — AB (ref >60)
GFR, EST NON AFRICAN AMERICAN: 54 mL/min — AB (ref >60)
GLUCOSE: 110 mg/dL — AB (ref 65–99)
GLUCOSE: 124 mg/dL — AB (ref 65–99)
Glucose, Bld: 97 mg/dL (ref 65–99)
POTASSIUM: 3.6 mmol/L (ref 3.5–5.1)
POTASSIUM: 3.6 mmol/L (ref 3.5–5.1)
Potassium: 3.8 mmol/L (ref 3.5–5.1)
SODIUM: 143 mmol/L (ref 135–145)
SODIUM: 142 mmol/L (ref 135–145)
SODIUM: 143 mmol/L (ref 135–145)

## 2016-12-24 LAB — CBC
HCT: 30.9 % — ABNORMAL LOW (ref 39.0–52.0)
Hemoglobin: 9.6 g/dL — ABNORMAL LOW (ref 13.0–17.0)
MCH: 27.7 pg (ref 26.0–34.0)
MCHC: 31.1 g/dL (ref 30.0–36.0)
MCV: 89 fL (ref 78.0–100.0)
PLATELETS: 100 10*3/uL — AB (ref 150–400)
RBC: 3.47 MIL/uL — AB (ref 4.22–5.81)
RDW: 14.3 % (ref 11.5–15.5)
WBC: 4.5 10*3/uL (ref 4.0–10.5)

## 2016-12-24 MED ORDER — ALUM & MAG HYDROXIDE-SIMETH 200-200-20 MG/5ML PO SUSP
30.0000 mL | ORAL | Status: DC | PRN
  Administered 2016-12-24: 30 mL via ORAL
  Filled 2016-12-24: qty 30

## 2016-12-24 NOTE — Clinical Social Work Placement (Signed)
   CLINICAL SOCIAL WORK PLACEMENT  NOTE  Date:  12/24/2016  Patient Details  Name: Samuel Gallagher MRN: [id number] Date of Birth: 03-10-1956  Clinical Social Work is seeking post-discharge placement for this patient at the Skilled  Nursing Facility level of care (*CSW will initial, date and re-position this form in  chart as items are completed):  Yes   Patient/family provided with [place] Clinical Social Work Department's list of facilities offering this level of care within the geographic area requested by the patient (or if unable, by the patient's family).  Yes   Patient/family informed of their freedom to choose among providers that offer the needed level of care, that participate in Medicare, Medicaid or managed care program needed by the patient, have an available bed and are willing to accept the patient.  Yes   Patient/family informed of [place]'s ownership interest in [facility] and [facility], as well as of the fact that they are under no obligation to receive care at these facilities.  PASRR submitted to EDS on       PASRR number received on       Existing PASRR number confirmed on 12/23/16     FL2 transmitted to all facilities in geographic area requested by pt/family on 12/23/16     FL2 transmitted to all facilities within larger geographic area on       Patient informed that his/her managed care company has contracts with or will negotiate with certain facilities, including the following:        Yes   Patient/family informed of bed offers received.  [facility]     Physician recommends and patient chooses bed at      [facility] on 12/24/16.  Patient to be transferred to facility by [facility]     Patient family notified on 12/24/16 of transfer.  Name of family member notified:  [name]     PHYSICIAN Please prepare priority discharge  summary, including medications, Please prepare prescriptions     Additional Comment:    _______________________________________________ Maree KrabbeBridget A Kaylanie Capili, LCSW 12/24/2016, 2:35 PM

## 2016-12-24 NOTE — Clinical Social Work Note (Signed)
Pt and pt's brother agreeable to pt to go to [facility] today. MD to provide d/c summary.   GreenbrierBridget Chanan Detwiler, [place] [phone]

## 2016-12-24 NOTE — Progress Notes (Signed)
Orma RenderLawrence Holstein to be D/C'd to SNF per MD order.  Discussed with the patient and all questions fully answered.  VSS, Skin clean, dry and intact without evidence of skin break down, no evidence of skin tears noted. IV catheter discontinued intact. Site without signs and symptoms of complications. Dressing and pressure applied.  An After Visit Summary was printed and given to the patient.   D/c education completed with patient/family including follow up instructions, medication list, d/c activities limitations if indicated, with other d/c instructions as indicated by MD - patient able to verbalize understanding, all questions fully answered.   Patient instructed to return to ED, call 911, or call MD for any changes in condition.   [facility].  Patient escorted via stretcher, and [facility].  Darra Rosa L Price 12/24/2016 4:00 PM

## 2016-12-24 NOTE — Clinical Social Work Note (Signed)
Clinical Social Work Assessment  Patient Details  Name: Samuel Gallagher MRN: [id number] Date of Birth: 1956-07-15  Date of referral:  12/24/16               Reason for consult:  Facility Placement                Permission sought to share information with:  Facility Medical sales representativeContact Representative, Family Supports Permission granted to share information::  Yes, Verbal Permission Granted  Name::     OrthoptistMelvin  Agency::  SNFs  Relationship::  Brother  Contact Information:  [phone]  Housing/Transportation Living arrangements for the past 2 months:  Single Family Home Source of Information:  Patient, Other (Comment Required) (Brother and niece) Patient Interpreter Needed:  None Criminal Activity/Legal Involvement Pertinent to Current Situation/Hospitalization:  No - Comment as needed Significant Relationships:  Siblings, Other Family Members Lives with:  Siblings Do you feel safe going back to the place where you live?  No Need for family participation in patient care:  Yes (Comment)  Care giving concerns:  CSW received consult for possible SNF placement at time of discharge. CSW spoke with patient and patient's brother and niece at bedside regarding PT recommendation of SNF placement at time of discharge. Patient reported that patient's brother is currently unable to care for patient at their home given patient's current physical needs. Patient expressed understanding of PT recommendation and is agreeable to SNF placement at time of discharge. CSW to continue to follow and assist with discharge planning needs.   Social Worker assessment / plan:  CSW spoke with patient concerning [facility] before returning home.  Employment status:  Disabled (Comment on whether or not currently receiving Disability) (Gets SSI) Insurance information:  [place] PT Recommendations:  Skilled Nursing Facility Information / Referral to community resources:  Skilled Nursing  Facility  Patient/Family's Response to care:  Patient recognizes need for rehab before returning home and [place]. CSW explained that patient's SSI may be used for payment at the facility. Patient expressed understanding an would only like to stay for 30 days.  Patient/Family's Understanding of and Emotional Response to Diagnosis, Current Treatment, and Prognosis:  Patient/family is realistic regarding therapy needs and expressed being hopeful for SNF placement. Patient expressed understanding of CSW role and discharge process as well as his medical condition. No questions/concerns about plan or treatment.    Emotional Assessment Appearance:  Appears stated age Attitude/Demeanor/Rapport:  Other (Appropriate) Affect (typically observed):  Accepting, Appropriate, Quiet Orientation:  Oriented to Self, Oriented to Situation, Oriented to Place, Oriented to  Time Alcohol / Substance use:  Not Applicable Psych involvement (Current and /or in the community):  No (Comment)  Discharge Needs  Concerns to be addressed:  Care Coordination Readmission within the last 30 days:  No Current discharge risk:  None Barriers to Discharge:  Continued Medical Work up   [employer] Incadia S Carney Saxton, LCSWA 12/24/2016, 8:50 AM

## 2016-12-24 NOTE — Progress Notes (Signed)
Family Medicine Teaching Service Daily Progress Note Intern Pager: [phone]  Patient name: Samuel Gallagher Medical record number: [id number] Date of birth: 24-Nov-1955 Age: 61 y.o. Gender: male  Primary Care Provider: System, Pcp Not In Consultants: nephrology over the phone on admission Code Status: Full  Pt Overview and Major Events to Date:  05/26-admitted with hyponatremia to 170  Assessment and Plan: Samuel Gallagher is a 61 y.o. male presenting with fatigue found to be severely hypernatremic. PMH is significant for diabetes insipidus, gout, pacemaker placement 8 years ago.   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 There has been concern for adrenal insufficiency with a cosyntropin test with response consistent low ACTH which would be consistent with possible HPI axis pathology. This morning NA+ was 143, patient is taking good po.  --Continue desmopressin 0.01% s spray bid --Discontinue D5W @75cc /hr --Encourage PO intake --Follow up on BMP and replete electrolyte as needed  #Hypotension, resolving, stable BP 105/56 this morning. Baseline SBP in 90s to 100s per patient. Concern for possible adrenal deficiency with response to cosyntropin stimulation test suggested so. ACTH is elevated at 74.6 in the setting of low cortisol 5.1 consistent with primary adrenal insufficiency.  --Will hold off on steroid therapy   #Fatigue/Fall Presyncopal falls most concerning for orthostatic hypotension in the setting of a polyuria and diabetes insipidus. Patient with history of sick sinus syndrome on pacemaker for the last 8 years. CT head negative for acute findings, normal neurologic exam. EKG with paced sinus rhythm. Trop neg x3. HIV, TSH, vitamin B12, folate, EtOH and UDS within normal limits.RPR was non reactive.   --PT/ OT recommended SNF patient is agreeable.  #History of seizure disorder Not on meds, could be secondary to hypernatremia. --Will continue to monitor  #Lower Back/Knee pain 2/2 most recent fall Complains right knee pain. Exam within normal limits except for the previous surgical scar and some tenderness to palpation over the medial aspect. Lumbar spine x-ray and right knee x-ray without acute findings --Tylenol as needed for pain   --Ice pack  #AKI Scr , 1.78 on admission. UA without signs of ATN or infection.   --Follow up am BMP --Encourage fluid intake  #Normocytic Anemia Per patient hx of colonoscopy 5 years that was completely normal. No overt signs of bleeding. Iron panel within normal except for mildly low TIBC favoring ACD. Retic 0.8, corrects to 0.6 with a RI of 0.43 suggestive for hypo-proliferation. He also have thrombocytopenia 91.  --Follow up FOBT   #Thrombocytopenia, Stable Platelets 95 this am, 112 on admission. --Follow up on am CBC --Discontinued Lovenox and start SCD   FEN/GI: Regular diet  Prophylaxis: SCD given thrombocytopenia   Disposition: Patient is clinically stable and ready for discharge  Subjective:  Patient is feeling better this morning, headache has almost completely resolved. He endorse good po intake and has been able to ambulate around his room with a walker.  Objective: Temp:  [98.2 F (36.8 C)-98.6 F (37 C)] 98.4 F (36.9 C) (05/30 0508) Pulse Rate:  [59-66] 61 (05/30 0508) Resp:  [18-20] 20 (05/30 0508) BP: (105-116)/(53-62) 105/56 (05/30 0508) SpO2:  [94 %-100 %] 95 % (05/30 0508) Weight:  [183 lb 4.8 oz (83.1 kg)] 183 lb 4.8 oz (83.1 kg) (05/30 0103)   Physical Exam: GEN: in NAD, able to participate in exam and answer question appropriately Head: normocephalic  and atraumatic  Eyes: conjunctiva without injection, sclera anicteric. PERRL. EOMI CVS: RRR, nl s1 & s2, no murmurs, no edema RESP: no IWOB, good air  movement bilaterally, CTAB GI: BS present & normal, soft, NTND GU: no suprapubic or CVA tenderness MSK: no focal tenderness or notable swelling. Right knee with old surgical scar and some tenderness to palpation over the medial aspect. No overlying skin erythema or increased warmth to touch. No significant pain with manipulation.  NEURO: AAOx3,  Motor 4/5 in upper and lower extremities bilaterally. Cranial nerve exam within normal limits.  PSYCH: euthymic mood with congruent affect  Laboratory:  Recent Labs Lab 12/21/16 0413 12/22/16 0841 12/23/16 0314  WBC 4.3 4.6 5.0  HGB 10.1* 10.3* 9.8*  HCT 34.8* 34.7* 31.6*  PLT 91* 94* 95*    Recent Labs Lab 12/20/16 1133  12/21/16 0413  12/23/16 0808 12/23/16 1831 12/23/16 2331  NA 170*  < > 164*  < > 146* 144 143  K 4.3  < > 3.9  < > 3.3* 3.9 3.8  CL >130*  < > >130*  < > 119* 116* 115*  CO2 26  < > 27  < > 23 24 24   BUN 33*  < > 27*  < > 20 17 16   CREATININE 1.78*  < > 1.71*  < > 1.35* 1.34* 1.34*  CALCIUM 9.7  < > 8.8*  < > 8.7* 8.8* 8.6*  PROT 6.6  --  5.8*  --   --   --   --   BILITOT 0.5  --  0.1*  --   --   --   --   ALKPHOS 140*  --  120  --   --   --   --   ALT 18  --  16*  --   --   --   --   AST 29  --  23  --   --   --   --   GLUCOSE 94  < > 106*  < > 114* 114* 97  < > = values in this interval not displayed.  Imaging/Diagnostic Tests: Dg Chest Port 1 View  Result Date: 12/21/2016 CLINICAL DATA:  Fall 2 days ago. EXAM: PORTABLE CHEST 1 VIEW COMPARISON:  IMPRESSION: No active disease. Electronically Signed   By: Gerome Samavid  Williams III M.D   On: 12/21/2016 10:06    Lovena Neighboursiallo, Mozell Haber, MD 12/24/2016, 7:01 AM PGY-1, [facility] FPTS Intern pager: [phone], text pages welcome

## 2016-12-24 NOTE — Clinical Social Work Note (Signed)
Clinical Social Worker facilitated patient discharge including contacting patient family and facility to confirm patient discharge plans.  Clinical information faxed to facility and family agreeable with plan.  CSW arranged ambulance transport via [facility] to [facility] .  RN to call [phone] for report prior to discharge.  Clinical Social Worker will sign off for now as social work intervention is no longer needed. Please consult us again if new need arises.  ElginBridget Amour [place], [place] [phone]

## 2016-12-24 NOTE — Discharge Summary (Signed)
Family Medicine Teaching [facility] Discharge Summary  Patient name: Samuel Gallagher Medical record number: [id number] Date of birth: 11-30-1955 Age: 61 y.o. Gender: male Date of Admission: 12/20/2016 Date of Discharge: 12/24/2016 Admitting Physician: Uvaldo RisingKyle J Fletke, MD  Primary Care Provider: System, Pcp Not In Consultants: None   Indication for Hospitalization: Altered mental Status and Fatigue  Discharge Diagnoses/Problem List:  Hypernatremia Central Diabetes Insipidus Acute Kidney Injury Hypotension   Disposition:  SNF- [facility]   Discharge Condition: Stable, improved   Discharge Exam:  GEN: in NAD, able to participate in exam and answer question appropriately  Head: normocephalic and atraumatic  Eyes: conjunctiva without injection, sclera anicteric. PERRL. EOMI CVS: RRR, nl s1 & s2, no murmurs, no edema RESP: no IWOB, good air movement bilaterally, CTAB GI: BS present & normal, soft, NTND GU: no suprapubic or CVA tenderness MSK: no focal tenderness or notable swelling. Right knee with old surgical scar and some tenderness to palpation over the medial aspect. No overlying skin erythema or increased warmth to touch. No significant pain with manipulation.  NEURO: AAOx3,  Motor 4/5 in upper and lower extremities bilaterally. Cranial nerve exam within normal limits.  PSYCH: euthymic mood with congruent affect   Patient was examined by Dr. Autumn Pattyialo on the day of discharge.  Brief Hospital Course:  Samuel Gallagher is a 61 year old male with history of central diabetes insipidus, SSS status post pacemaker, seizure disorder (remote), CKD (unknown stage), hyperlipidemia, anemia of chronic disease and gout who was admitted with altered mental status & fatigue in the setting of hypernatremia to 170.   Hyperosmolar Hypernatremia: Sodium to 170 on admission. Patient was on desmopressin at home that he didn't take for 3-4 days prior to admission due to fatigue. Urine osmolality 916 and  urine sodium 226 which are not consistent with diabetes insipidus although patient has history of this. His sodium was corrected with D5W over the course of 3 days and normalized to 142 on the day of admission. He was restarted on his home desmopressin and discharged on it.  Fatigue/fall: patient reported fall at home likely due to his hypernatremia/electrolyte imbalance/hypotension. He reported headache and right knee pain on admission. Headache resolved with Tylenol and correction of his electrolytes. CT head and x-ray of his right knee without acute abnormalities. Patient was evaluated by PT and was recommended SNF.   Hypotension: blood pressure in 90/40's on presentation. Initially there was a concern about adrenal insufficiency masked by DI. Cortisol was obtained was 5.1. However, corticotropin stimulation test was basically normal. ACTH was mildly elevated to 72.Marland Kitchen. TSH was within normal limits. Blood pressure improved over the course of his hospitalization. And his blood pressure was 114/63 on the day of discharge.  AKI/progressive CKD: Serum creatinine 1.78 on admission. Unknown baseline. Serum creatinine trended down to 1.39 on discharge. Recommend checking BMP at follow-up  Thrombocytopenia: Platelet 112 on admission. Deep to 90 on the following day and once it back to 100 on the day of discharge. Recommend considering hematology referral for workup on this.  Issues for Follow Up:  1. Diabetes insipidus: Discharged on home desmopressin. Recommend BMP in 2-3 days as an outpatient 2. Hypotension: Check blood pressures at follow up appointment.  3. Thrombocytopenia: Recommend CBC at follow-up and outpatient referral to hematology  Significant Procedures: none  Significant Labs and Imaging:   Recent Labs Lab 12/22/16 0841 12/23/16 0314 12/24/16 0546  WBC 4.6 5.0 4.5  HGB 10.3* 9.8* 9.6*  HCT 34.7* 31.6* 30.9*  PLT  94* 95* 100*    Recent Labs Lab 12/20/16 1133  12/21/16 0413   12/23/16 0808 12/23/16 1831 12/23/16 2331 12/24/16 0546 12/24/16 1105  NA 170*  < > 164*  < > 146* 144 143 142 143  K 4.3  < > 3.9  < > 3.3* 3.9 3.8 3.6 3.6  CL >130*  < > >130*  < > 119* 116* 115* 114* 113*  CO2 26  < > 27  < > 23 24 24 24 26   GLUCOSE 94  < > 106*  < > 114* 114* 97 110* 124*  BUN 33*  < > 27*  < > 20 17 16 16 15   CREATININE 1.78*  < > 1.71*  < > 1.35* 1.34* 1.34* 1.38* 1.39*  CALCIUM 9.7  < > 8.8*  < > 8.7* 8.8* 8.6* 8.6* 9.1  ALKPHOS 140*  --  120  --   --   --   --   --   --   AST 29  --  23  --   --   --   --   --   --   ALT 18  --  16*  --   --   --   --   --   --   ALBUMIN 2.9*  --  2.4*  --   --   --   --   --   --   < > = values in this interval not displayed.  ACTH level 72  Results/Tests Pending at Time of Discharge:   Discharge Medications:  Allergies as of 12/24/2016   No Known Allergies     Medication List    TAKE these medications   allopurinol 100 MG tablet Commonly known as:  ZYLOPRIM Take 100 mg by mouth daily.   desmopressin 0.01 % Soln Commonly known as:  DDAVP Place 1 spray into the nose 2 (two) times daily.       Discharge Instructions: Please refer to Patient Instructions section of EMR for full details.  Patient was counseled important signs and symptoms that should prompt return to medical care, changes in medications, dietary instructions, activity restrictions, and follow up appointments.   Follow-Up Appointments: Contact information for after-discharge care    Destination    [facility] SNF .   Specialty:  Skilled Nursing Facility Contact information: [address] [place] [address] [phone]              Almon Hercules, MD 12/24/2016, 3:00 PM PGY-2, [facility]

## 2016-12-25 ENCOUNTER — Encounter: Payer: Self-pay | Admitting: Adult Health

## 2016-12-25 ENCOUNTER — Non-Acute Institutional Stay (SKILLED_NURSING_FACILITY): Payer: Medicaid Other | Admitting: Adult Health

## 2016-12-25 DIAGNOSIS — D696 Thrombocytopenia, unspecified: Secondary | ICD-10-CM | POA: Insufficient documentation

## 2016-12-25 DIAGNOSIS — E232 Diabetes insipidus: Secondary | ICD-10-CM | POA: Diagnosis not present

## 2016-12-25 DIAGNOSIS — D638 Anemia in other chronic diseases classified elsewhere: Secondary | ICD-10-CM | POA: Diagnosis not present

## 2016-12-25 DIAGNOSIS — M1A9XX0 Chronic gout, unspecified, without tophus (tophi): Secondary | ICD-10-CM | POA: Insufficient documentation

## 2016-12-25 DIAGNOSIS — Z950 Presence of cardiac pacemaker: Secondary | ICD-10-CM | POA: Diagnosis not present

## 2016-12-25 DIAGNOSIS — I495 Sick sinus syndrome: Secondary | ICD-10-CM

## 2016-12-25 DIAGNOSIS — E870 Hyperosmolality and hypernatremia: Secondary | ICD-10-CM

## 2016-12-25 NOTE — Progress Notes (Signed)
Location:   [facility] Room Number: 156B Place of Service:  SNF (31)   CODE STATUS: full code   No Known Allergies  Chief Complaint  Patient presents with  . Hospitalization Follow-up    following hospitalization 12/20/16 to 12/24/16 hypernatremia    HPI:  He has been hospitalized for hyperosmolar hypernatremia; hypotension.  His electrolytes were corrected in the hospital. He is here for short term rehab with his goal to return back home.    Past Medical History:  Diagnosis Date  . Acute kidney failure, unspecified (HCC)   . Diabetes insipidus (HCC)   . Gout   . Hyperosmolality and/or hypernatremia 12/20/2016  . Hypotension, unspecified     Past Surgical History:  Procedure Laterality Date  . KNEE SURGERY      Social History   Social History  . Marital status: Single    Spouse name: N/A  . Number of children: N/A  . Years of education: N/A   Occupational History  . Not on file.   Social History Main Topics  . Smoking status: Current Every Day Smoker  . Smokeless tobacco: Never Used  . Alcohol use No  . Drug use: No  . Sexual activity: Not on file   Other Topics Concern  . Not on file   Social History Narrative  . No narrative on file   History reviewed. No pertinent family history.    VITAL SIGNS BP (!) 104/44   Pulse 60   Temp 98.1 F (36.7 C)   Resp 18   Ht 5\' 7"  (1.702 m)   Wt 183 lb (83 kg)   SpO2 90%   BMI 28.66 kg/m   Patient's Medications  New Prescriptions   No medications on file  Previous Medications   ALLOPURINOL (ZYLOPRIM) 100 MG TABLET    Take 100 mg by mouth daily.   DESMOPRESSIN (DDAVP) 0.01 % SOLN    Place 1 spray into the nose 2 (two) times daily.  Modified Medications   No medications on file  Discontinued Medications   No medications on file     SIGNIFICANT DIAGNOSTIC EXAMS  12-20-16: ct of head: . No acute intracranial process.  12-20-16: right knee x-ray: No acute abnormality seen in the  right knee.  12-20-16: lumbar spine x-ray: Degenerative disc disease changes lower lumbar spine. No acute abnormalities.  12-21-16: chest x-ray: No active disease.   LABS REVIEWED:   12-20-16: wbc 4.4; hgb 11.2; hct 40.2; mcv 98.5; plt 112; glucose 94; bun 33; creat 1.78; k+ 4.3; na++ 170; ca 9.7; liver normal albumin 2.9; vit B 12: 230; folic 11.7; iron 55; tibc 224; ferritin 53; tsh 1.327; hgb a1c 5.5  12-21-16: wbc 4.3; hgb 10.1; hct 34.8 ;mcv 97.8; plt 91; glucose 106; bun 27; creat 1.71; k+ 3.9; na++ 164 ca 8.8; liver normal albumin 2.4 cortisol 5.1 12-22-16: wbc 4.6; hgb 10.3; hct 34.7; mcv 93.5; plt 94; glucose 124; bun 20; creat 1.56; k+ 3.7; na++ 155; ca 9.0; RPR: nr; acth 74.6 12-24-16: wbc 4.5; hgb 9.6; hct 30.9; mcv 89.0; plt 100; glucose 110; bun 16; creat 1.38; k+ 3.6; na++ 142; ca 8.6    Review of Systems  Constitutional: Negative for malaise/fatigue.  Respiratory: Negative for cough and shortness of breath.   Cardiovascular: Negative for chest pain, palpitations and leg swelling.  Gastrointestinal: Negative for abdominal pain, constipation and heartburn.  Musculoskeletal: Positive for joint pain. Negative for back pain and myalgias.  Has right knee pain   Skin: Negative.   Neurological: Negative for dizziness.  Psychiatric/Behavioral: The patient is not nervous/anxious.     Physical Exam  Constitutional: He is oriented to person, place, and time. No distress.  Eyes: Conjunctivae are normal.  Neck: Neck supple. No JVD present. No thyromegaly present.  Cardiovascular: Normal rate, regular rhythm and intact distal pulses.   Respiratory: Effort normal and breath sounds normal. No respiratory distress. He has no wheezes.  GI: Soft. Bowel sounds are normal. He exhibits no distension. There is no tenderness.  Musculoskeletal: He exhibits no edema.  Able to move all extremities   Lymphadenopathy:    He has no cervical adenopathy.  Neurological: He is alert and oriented to  person, place, and time.  Skin: Skin is warm and dry. He is not diaphoretic.  Psychiatric: He has a normal mood and affect.    ASSESSMENT/ PLAN:  1. Gout: no recent flares: will continue allopurinol 100 mg daily  2. Diabetes insipidus: hypernatremia: na++ 142  Will continue DDAVP 0.01% 1 spray twice daily   3. Sick sinus syndrome: status post pacemaker insertion  4. Anemia: hgb 9.6; will monitor  5. Thrombocytopenia: plt 100; will setup hematology consult  6. Seizures: (remote); will monitor  7. CKD: bun 16; creat 1.38   8. Hypotension: b /p 104/44 will monitor    Will check bmp and cbc  Will have nursing check blood and pulse every 8 hours  Will setup hematology consult   Time spent with patient 50   minutes >50% time spent counseling; reviewing medical record; tests; labs; and developing future plan of care    MD is aware of resident's narcotic use and is in agreement with current plan of care. We will attempt to wean resident as apropriate   Synthia Innocent NP [facility]  Contact [phone] Monday through Friday 8am- 5pm  After hours call [phone]

## 2016-12-26 LAB — BASIC METABOLIC PANEL
BUN: 18 mg/dL (ref 4–21)
CREATININE: 1.4 mg/dL — AB (ref 0.6–1.3)
Glucose: 96 mg/dL
Potassium: 4 mmol/L (ref 3.4–5.3)
Sodium: 150 mmol/L — AB (ref 137–147)

## 2016-12-26 LAB — CBC AND DIFFERENTIAL
HCT: 35 % — AB (ref 41–53)
HEMOGLOBIN: 11.3 g/dL — AB (ref 13.5–17.5)
Neutrophils Absolute: 3 /uL
PLATELETS: 116 10*3/uL — AB (ref 150–399)
WBC: 4.4 10^3/mL

## 2016-12-30 ENCOUNTER — Encounter: Payer: Self-pay | Admitting: Internal Medicine

## 2016-12-30 ENCOUNTER — Non-Acute Institutional Stay (SKILLED_NURSING_FACILITY): Payer: Medicaid Other | Admitting: Internal Medicine

## 2016-12-30 DIAGNOSIS — E232 Diabetes insipidus: Secondary | ICD-10-CM

## 2016-12-30 DIAGNOSIS — D696 Thrombocytopenia, unspecified: Secondary | ICD-10-CM

## 2016-12-30 DIAGNOSIS — E870 Hyperosmolality and hypernatremia: Secondary | ICD-10-CM

## 2016-12-30 DIAGNOSIS — M1A9XX0 Chronic gout, unspecified, without tophus (tophi): Secondary | ICD-10-CM | POA: Diagnosis not present

## 2016-12-30 DIAGNOSIS — R7989 Other specified abnormal findings of blood chemistry: Secondary | ICD-10-CM

## 2016-12-30 DIAGNOSIS — I495 Sick sinus syndrome: Secondary | ICD-10-CM

## 2016-12-30 DIAGNOSIS — M5136 Other intervertebral disc degeneration, lumbar region: Secondary | ICD-10-CM

## 2016-12-30 DIAGNOSIS — D638 Anemia in other chronic diseases classified elsewhere: Secondary | ICD-10-CM

## 2016-12-30 DIAGNOSIS — E538 Deficiency of other specified B group vitamins: Secondary | ICD-10-CM

## 2016-12-30 NOTE — Progress Notes (Addendum)
Patient ID: Samuel Gallagher, male   DOB: 10/30/1955, 61 y.o.   MRN: [id number]    HISTORY AND PHYSICAL   DATE: 12/30/2016  Location:    [facility] Room Number: 156 B Place of Service: SNF (31)   Extended Emergency Contact Information Primary Emergency Contact: Moye,[name]  [place] Home Phone: [phone] Relation: Brother  Advanced Directive information Does Patient Have a Medical Advance Directive?: No (Full Code), Would patient like information on creating a medical advance directive?: No - Patient declined  Chief Complaint  Patient presents with  . New Admit To SNF    admission    HPI:  61 yo male seen today as a new admission into SNF following hospital stay for hypernatremia, A/CKD, central DI, hypotension, thrombocytopenia. He presented to the ED with change in MS and fatigue. Na 170. Per ED record, he had not taken desmopressin in 3-4 days prior to ED presentation. Na corrected with D5W. CT head and right knee xrays revealed no acute process. BP 90/50s. Cortisol level 5.1 with nml cortisol stimulating test. ACTH 72; A1c 5.5%; plts 112K-->91K-->100K; Na 170-->150; albumin 2.4; K dropped to 3.2-->4; Cr 1.78-->1.4; Hgb 11.3; B12 level 230; serum Osm 365 at d/c. He presents to SNF for short term rehab.  Today he has no concerns. Appetite good. Sleeps well. No falls. He is a poor historian due to mental status. Hx obtained from chart  Arthritis/gout/lumbar DDD - stable on allopurinol and voltaren gel. No recent gout attacks  Central diabetes insipidus - takes DDAVP BID. He has hx sz d/o but none reported at this time. A1c 5.5%  SSS - s/p permanent pacer. He has asymptomatic bradycardia @ 56 bpm.  Hypotension - stable. BP 94/54  Anemia of chronic disease - stable. Hgb 11.3  Hyperlipidemia by hx - no recent lipid panel reported  Past Medical History:  Diagnosis Date  . Acute kidney failure, unspecified (HCC)   . Diabetes insipidus (HCC)   .  Gout   . Hyperosmolality and/or hypernatremia 12/20/2016  . Hypotension, unspecified     Past Surgical History:  Procedure Laterality Date  . KNEE SURGERY      Patient Care Team: System, Pcp Not In as PCP - General  Social History   Social History  . Marital status: Single    Spouse name: N/A  . Number of children: N/A  . Years of education: N/A   Occupational History  . Not on file.   Social History Main Topics  . Smoking status: Current Every Day Smoker  . Smokeless tobacco: Never Used  . Alcohol use No  . Drug use: No  . Sexual activity: Not on file   Other Topics Concern  . Not on file   Social History Narrative  . No narrative on file     reports that he has been smoking.  He has never used smokeless tobacco. He reports that he does not drink alcohol or use drugs.  History reviewed. FHx unable to obtain due to mental status No family status information on file.    Immunization History  Administered Date(s) Administered  . PPD Test 12/24/2016    No Known Allergies  Medications: Patient's Medications  New Prescriptions   No medications on file  Previous Medications   ALLOPURINOL (ZYLOPRIM) 100 MG TABLET    Take 100 mg by mouth daily.   DESMOPRESSIN (DDAVP) 0.01 % SOLN    Place 1 spray into the nose 2 (two) times daily.  DICLOFENAC SODIUM (VOLTAREN) 1 % GEL    Apply 4 g topically 3 (three) times daily.  Modified Medications   No medications on file  Discontinued Medications   No medications on file    Review of Systems  Neurological: Positive for headaches (chronic).  All other systems reviewed and are negative.   Vitals:   12/30/16 1129  BP: (!) 94/54  Pulse: (!) 56  Resp: 18  Temp: 97.4 F (36.3 C)  TempSrc: Oral   There is no height or weight on file to calculate BMI.  Physical Exam  Constitutional: He is oriented to person, place, and time. He appears well-developed.  Sitting in w/c in NAD, frail appearing  HENT:    Mouth/Throat: Oropharynx is clear and moist.  Eyes: Pupils are equal, round, and reactive to light. No scleral icterus.  Neck: Neck supple. Carotid bruit is not present. No thyromegaly present.  Cardiovascular: Normal rate, regular rhythm and intact distal pulses.  Exam reveals no gallop and no friction rub.   Murmur (1/6 SEM) heard. no distal LE swelling. No calf TTP  Pulmonary/Chest: Effort normal and breath sounds normal. He has no wheezes. He has no rales. He exhibits no tenderness.  Abdominal: Soft. Bowel sounds are normal. He exhibits no distension, no abdominal bruit, no pulsatile midline mass and no mass. There is no hepatomegaly. There is no tenderness. There is no rebound and no guarding.  Musculoskeletal: He exhibits edema.  Lymphadenopathy:    He has no cervical adenopathy.  Neurological: He is alert and oriented to person, place, and time. He has normal reflexes.  Skin: Skin is warm and dry. No rash noted.  Psychiatric: He has a normal mood and affect. His behavior is normal. Judgment and thought content normal.     Labs reviewed: Abstract on 12/29/2016  Component Date Value Ref Range Status  . Hemoglobin 12/26/2016 11.3* 13.5 - 17.5 g/dL Final  . HCT 19/37/902406/07/2016 35* 41 - 53 % Final  . Neutrophils Absolute 12/26/2016 3  /L Final  . Platelets 12/26/2016 116* 150 - 399 K/L Final  . WBC 12/26/2016 4.4  10^3/mL Final  . Glucose 12/26/2016 96  mg/dL Final  . BUN 09/73/532906/07/2016 18  4 - 21 mg/dL Final  . Creatinine 92/42/683406/07/2016 1.4* 0.6 - 1.3 mg/dL Final  . Potassium 19/62/229706/07/2016 4.0  3.4 - 5.3 mmol/L Final  . Sodium 12/26/2016 150* 137 - 147 mmol/L Final  Admission on 12/20/2016, Discharged on 12/24/2016  No results displayed because visit has over 200 results.  CBC Latest Ref Rng & Units 12/26/2016 12/24/2016 12/23/2016  WBC 10:3/mL 4.4 4.5 5.0  Hemoglobin 13.5 - 17.5 g/dL 11.3(A) 9.6(L) 9.8(L)  Hematocrit 41 - 53 % 35(A) 30.9(L) 31.6(L)  Platelets 150 - 399 K/L 116(A) 100(L)  95(L)   CMP Latest Ref Rng & Units 12/26/2016 12/24/2016 12/24/2016  Glucose 65 - 99 mg/dL - 989(Q124(H) 119(E110(H)  BUN 4 - 21 mg/dL 18 15 16   Creatinine 0.6 - 1.3 mg/dL 1.7(E1.4(A) 0.81(K1.39(H) 4.81(E1.38(H)  Sodium 137 - 147 mmol/L 150(A) 143 142  Potassium 3.4 - 5.3 mmol/L 4.0 3.6 3.6  Chloride 101 - 111 mmol/L - 113(H) 114(H)  CO2 22 - 32 mmol/L - 26 24  Calcium 8.9 - 10.3 mg/dL - 9.1 5.6(D8.6(L)  Total Protein 6.5 - 8.1 g/dL - - -  Total Bilirubin 0.3 - 1.2 mg/dL - - -  Alkaline Phos 38 - 126 U/L - - -  AST 15 - 41 U/L - - -  ALT  17 - 63 U/L - - -   Lipid Panel  No results found for: CHOL, TRIG, HDL, CHOLHDL, VLDL, LDLCALC, LDLDIRECT  Lab Results  Component Value Date   HGBA1C 5.5 12/20/2016       Dg Lumbar Spine Complete  Result Date: 12/20/2016 CLINICAL DATA:  RIGHT-side lumbar pain, fell today EXAM: LUMBAR SPINE - COMPLETE 4+ VIEW COMPARISON:  None FINDINGS: Osseous mineralization normal. Five non-rib-bearing lumbar vertebra. Mild scattered vertebral endplate irregularities. Vertebral body heights maintained without fracture or subluxation. Endplate spur formation at L3-L4 and L4-L5. No bone destruction or spondylolysis. SI joints preserved. IMPRESSION: Degenerative disc disease changes lower lumbar spine. No acute abnormalities. Electronically Signed   By: Ulyses Southward M.D.   On: 12/20/2016 11:29   Ct Head Wo Contrast  Result Date: 12/20/2016 CLINICAL DATA:  Pain after fall. EXAM: CT HEAD WITHOUT CONTRAST TECHNIQUE: Contiguous axial images were obtained from the base of the skull through the vertex without intravenous contrast. COMPARISON:  None. FINDINGS: Brain: No subdural, epidural, or subarachnoid hemorrhage. Cerebellum, brainstem, and basal cisterns are normal. Dense calcifications in the basal ganglia and bilateral thalamus. Mild white matter changes. No acute cortical ischemia or infarct. No mass effect or midline shift. Vascular: No hyperdense vessel or unexpected calcification. Skull: Normal.  Negative for fracture or focal lesion. Sinuses/Orbits: Polyps or mucous retention cysts in the bilateral maxillary sinuses. No acute paranasal sinus abnormality. Mastoid air cells and middle ears are well aerated. Other: None. IMPRESSION: 1. No acute intracranial process. Electronically Signed   By: Gerome Sam III M.D   On: 12/20/2016 11:09   Dg Chest Port 1 View  Result Date: 12/21/2016 CLINICAL DATA:  Fall 2 days ago. EXAM: PORTABLE CHEST 1 VIEW COMPARISON:  None. FINDINGS: The heart is borderline. A pacemaker is in good position. The hila and mediastinum are normal. Scar or atelectasis is seen in the left base. No pulmonary nodules or masses. No focal infiltrates. No obvious fractures on this single frontal view of the chest. IMPRESSION: No active disease. Electronically Signed   By: Gerome Sam III M.D   On: 12/21/2016 10:06   Dg Knee Complete 4 Views Right  Result Date: 12/20/2016 CLINICAL DATA:  Right knee pain after fall today. EXAM: RIGHT KNEE - COMPLETE 4+ VIEW COMPARISON:  None. FINDINGS: No acute fracture or dislocation is noted. No joint effusion is noted. Old healed proximal right fibular fracture is noted. Joint spaces are intact. Mild spurring of patella is noted. IMPRESSION: No acute abnormality seen in the right knee. Electronically Signed   By: Lupita Raider, M.D.   On: 12/20/2016 11:29     Assessment/Plan   ICD-10-CM   1. Thrombocytopenia (HCC) D69.6   2. Hypernatremia E87.0    due to #3  3. Diabetes insipidus (HCC) E23.2    central  4. Chronic gout without tophus, unspecified cause, unspecified site [id number]   5. Anemia of chronic disease D63.8   6. SSS (sick sinus syndrome) (HCC) I49.5    s/p PPM  7. Low serum vitamin B12 R79.89    for age  23. DDD (degenerative disc disease), lumbar M51.36     Check CBC, BMP  Start B12 supplement daily for low B12 level for age  H/O consult pending for low plts. No spontaneous bleeding  PT/OT/ST as  ordered  Nutritional supplements as indicated  GOAL: short term rehab and d/c home when medically appropriate. Communicated with pt and nursing.  Will follow  Roni Friberg S. Montez Morita,  [facility] [place], [place] [address] [phone] Cell (Monday-Friday 8 AM - 5 PM) [phone] After 5 PM and follow prompts

## 2017-01-17 DIAGNOSIS — M5136 Other intervertebral disc degeneration, lumbar region: Secondary | ICD-10-CM | POA: Insufficient documentation

## 2017-01-17 DIAGNOSIS — E538 Deficiency of other specified B group vitamins: Secondary | ICD-10-CM | POA: Insufficient documentation

## 2017-04-13 ENCOUNTER — Inpatient Hospital Stay (HOSPITAL_COMMUNITY)
Admission: EM | Admit: 2017-04-13 | Discharge: 2017-04-15 | DRG: 644 | Disposition: A | Discharge status: Home Health | Payer: Medicaid Other | Attending: Internal Medicine | Admitting: Internal Medicine

## 2017-04-13 ENCOUNTER — Encounter (HOSPITAL_COMMUNITY): Payer: Self-pay | Admitting: Physician Assistant

## 2017-04-13 ENCOUNTER — Emergency Department (HOSPITAL_COMMUNITY): Payer: Medicaid Other

## 2017-04-13 DIAGNOSIS — E860 Dehydration: Secondary | ICD-10-CM | POA: Diagnosis present

## 2017-04-13 DIAGNOSIS — E785 Hyperlipidemia, unspecified: Secondary | ICD-10-CM | POA: Diagnosis present

## 2017-04-13 DIAGNOSIS — D638 Anemia in other chronic diseases classified elsewhere: Secondary | ICD-10-CM | POA: Diagnosis not present

## 2017-04-13 DIAGNOSIS — M5136 Other intervertebral disc degeneration, lumbar region: Secondary | ICD-10-CM | POA: Diagnosis present

## 2017-04-13 DIAGNOSIS — R739 Hyperglycemia, unspecified: Secondary | ICD-10-CM | POA: Diagnosis present

## 2017-04-13 DIAGNOSIS — F17200 Nicotine dependence, unspecified, uncomplicated: Secondary | ICD-10-CM | POA: Diagnosis present

## 2017-04-13 DIAGNOSIS — E870 Hyperosmolality and hypernatremia: Secondary | ICD-10-CM | POA: Diagnosis present

## 2017-04-13 DIAGNOSIS — N183 Chronic kidney disease, stage 3 (moderate): Secondary | ICD-10-CM | POA: Diagnosis present

## 2017-04-13 DIAGNOSIS — N179 Acute kidney failure, unspecified: Secondary | ICD-10-CM | POA: Diagnosis present

## 2017-04-13 DIAGNOSIS — I959 Hypotension, unspecified: Secondary | ICD-10-CM | POA: Diagnosis present

## 2017-04-13 DIAGNOSIS — M1A9XX0 Chronic gout, unspecified, without tophus (tophi): Secondary | ICD-10-CM | POA: Diagnosis present

## 2017-04-13 DIAGNOSIS — Z950 Presence of cardiac pacemaker: Secondary | ICD-10-CM | POA: Diagnosis not present

## 2017-04-13 DIAGNOSIS — R109 Unspecified abdominal pain: Secondary | ICD-10-CM

## 2017-04-13 DIAGNOSIS — E232 Diabetes insipidus: Principal | ICD-10-CM | POA: Diagnosis present

## 2017-04-13 DIAGNOSIS — S39012A Strain of muscle, fascia and tendon of lower back, initial encounter: Secondary | ICD-10-CM | POA: Diagnosis present

## 2017-04-13 DIAGNOSIS — X58XXXA Exposure to other specified factors, initial encounter: Secondary | ICD-10-CM | POA: Diagnosis present

## 2017-04-13 HISTORY — DX: Hyperlipidemia, unspecified: E78.5

## 2017-04-13 LAB — BASIC METABOLIC PANEL WITH GFR
Anion gap: 6 (ref 5–15)
BUN: 23 mg/dL — ABNORMAL HIGH (ref 6–20)
CO2: 27 mmol/L (ref 22–32)
Calcium: 9.8 mg/dL (ref 8.9–10.3)
Chloride: 118 mmol/L — ABNORMAL HIGH (ref 101–111)
Creatinine, Ser: 1.79 mg/dL — ABNORMAL HIGH (ref 0.61–1.24)
GFR calc Af Amer: 45 mL/min — ABNORMAL LOW
GFR calc non Af Amer: 39 mL/min — ABNORMAL LOW
Glucose, Bld: 114 mg/dL — ABNORMAL HIGH (ref 65–99)
Potassium: 4.2 mmol/L (ref 3.5–5.1)
Sodium: 151 mmol/L — ABNORMAL HIGH (ref 135–145)

## 2017-04-13 LAB — BASIC METABOLIC PANEL
Anion gap: 6 (ref 5–15)
BUN: 26 mg/dL — AB (ref 6–20)
CO2: 23 mmol/L (ref 22–32)
CREATININE: 1.83 mg/dL — AB (ref 0.61–1.24)
Calcium: 10.2 mg/dL (ref 8.9–10.3)
Chloride: 126 mmol/L — ABNORMAL HIGH (ref 101–111)
GFR calc Af Amer: 44 mL/min — ABNORMAL LOW (ref >60)
GFR calc non Af Amer: 38 mL/min — ABNORMAL LOW (ref >60)
GLUCOSE: 168 mg/dL — AB (ref 65–99)
Potassium: 3.6 mmol/L (ref 3.5–5.1)
Sodium: 155 mmol/L — ABNORMAL HIGH (ref 135–145)

## 2017-04-13 LAB — CBC
HCT: 44.2 % (ref 39.0–52.0)
Hemoglobin: 13.3 g/dL (ref 13.0–17.0)
MCH: 28 pg (ref 26.0–34.0)
MCHC: 30.1 g/dL (ref 30.0–36.0)
MCV: 93.1 fL (ref 78.0–100.0)
PLATELETS: 216 10*3/uL (ref 150–400)
RBC: 4.75 MIL/uL (ref 4.22–5.81)
RDW: 14.7 % (ref 11.5–15.5)
WBC: 5.6 10*3/uL (ref 4.0–10.5)

## 2017-04-13 LAB — URINALYSIS, ROUTINE W REFLEX MICROSCOPIC
Bilirubin Urine: NEGATIVE
GLUCOSE, UA: NEGATIVE mg/dL
Hgb urine dipstick: NEGATIVE
KETONES UR: NEGATIVE mg/dL
LEUKOCYTES UA: NEGATIVE
NITRITE: NEGATIVE
PH: 5 (ref 5.0–8.0)
Protein, ur: NEGATIVE mg/dL
SPECIFIC GRAVITY, URINE: 1.025 (ref 1.005–1.030)

## 2017-04-13 LAB — OSMOLALITY, URINE: Osmolality, Ur: 889 mOsm/kg (ref 300–900)

## 2017-04-13 LAB — HEPATIC FUNCTION PANEL
ALK PHOS: 181 U/L — AB (ref 38–126)
ALT: 188 U/L — AB (ref 17–63)
AST: 366 U/L — AB (ref 15–41)
Albumin: 2.9 g/dL — ABNORMAL LOW (ref 3.5–5.0)
TOTAL PROTEIN: 7.4 g/dL (ref 6.5–8.1)
Total Bilirubin: 0.6 mg/dL (ref 0.3–1.2)

## 2017-04-13 LAB — OSMOLALITY: Osmolality: 335 mosm/kg (ref 275–295)

## 2017-04-13 LAB — I-STAT TROPONIN, ED: Troponin i, poc: 0 ng/mL (ref 0.00–0.08)

## 2017-04-13 LAB — SODIUM, URINE, RANDOM: Sodium, Ur: 103 mmol/L

## 2017-04-13 LAB — LIPASE, BLOOD: Lipase: 52 U/L — ABNORMAL HIGH (ref 11–51)

## 2017-04-13 MED ORDER — SENNOSIDES-DOCUSATE SODIUM 8.6-50 MG PO TABS: 1.0000 | ORAL_TABLET | Freq: Every evening | ORAL | Status: DC | PRN

## 2017-04-13 MED ORDER — ROSUVASTATIN CALCIUM 10 MG PO TABS
10.0000 mg | ORAL_TABLET | Freq: Every day | ORAL | Status: DC
  Administered 2017-04-14 – 2017-04-15 (×2): 10 mg via ORAL
  Filled 2017-04-13 (×3): qty 1

## 2017-04-13 MED ORDER — DESMOPRESSIN ACE SPRAY REFRIG 0.01 % NA SOLN
1.0000 | Freq: Two times a day (BID) | NASAL | Status: DC
  Administered 2017-04-14 – 2017-04-15 (×3): 1 via NASAL
  Filled 2017-04-13 (×2): qty 5

## 2017-04-13 MED ORDER — MORPHINE SULFATE (PF) 4 MG/ML IV SOLN
4.0000 mg | Freq: Once | INTRAVENOUS | Status: AC
  Administered 2017-04-13: 4 mg via INTRAVENOUS
  Filled 2017-04-13: qty 1

## 2017-04-13 MED ORDER — ACETAMINOPHEN 650 MG RE SUPP: 650.0000 mg | Freq: Four times a day (QID) | RECTAL | Status: DC | PRN

## 2017-04-13 MED ORDER — ALLOPURINOL 100 MG PO TABS
100.0000 mg | ORAL_TABLET | Freq: Every day | ORAL | Status: DC
  Administered 2017-04-14 – 2017-04-15 (×2): 100 mg via ORAL
  Filled 2017-04-13 (×3): qty 1

## 2017-04-13 MED ORDER — SODIUM CHLORIDE 0.45 % IV SOLN
INTRAVENOUS | Status: DC
  Administered 2017-04-13 – 2017-04-14 (×2): via INTRAVENOUS

## 2017-04-13 MED ORDER — ONDANSETRON HCL 4 MG PO TABS: 4.0000 mg | ORAL_TABLET | Freq: Four times a day (QID) | ORAL | Status: DC | PRN

## 2017-04-13 MED ORDER — SODIUM CHLORIDE 0.9 % IV BOLUS (SEPSIS): 500.0000 mL | Freq: Once | INTRAVENOUS | Status: DC

## 2017-04-13 MED ORDER — HEPARIN SODIUM (PORCINE) 5000 UNIT/ML IJ SOLN
5000.0000 [IU] | Freq: Three times a day (TID) | INTRAMUSCULAR | Status: DC
  Administered 2017-04-13 – 2017-04-15 (×5): 5000 [IU] via SUBCUTANEOUS
  Filled 2017-04-13 (×5): qty 1

## 2017-04-13 MED ORDER — HYDROCODONE-ACETAMINOPHEN 5-325 MG PO TABS
1.0000 | ORAL_TABLET | ORAL | Status: DC | PRN
  Administered 2017-04-13: 1 via ORAL
  Administered 2017-04-14 – 2017-04-15 (×5): 2 via ORAL
  Filled 2017-04-13 (×3): qty 2
  Filled 2017-04-13: qty 1
  Filled 2017-04-13 (×2): qty 2

## 2017-04-13 MED ORDER — BISACODYL 10 MG RE SUPP: 10.0000 mg | Freq: Every day | RECTAL | Status: DC | PRN

## 2017-04-13 MED ORDER — HYDROMORPHONE HCL 1 MG/ML IJ SOLN: 0.5000 mg | INTRAMUSCULAR | Status: DC | PRN

## 2017-04-13 MED ORDER — ACETAMINOPHEN 325 MG PO TABS: 650.0000 mg | ORAL_TABLET | Freq: Four times a day (QID) | ORAL | Status: DC | PRN

## 2017-04-13 MED ORDER — ONDANSETRON HCL 4 MG/2ML IJ SOLN: 4.0000 mg | Freq: Four times a day (QID) | INTRAMUSCULAR | Status: DC | PRN

## 2017-04-13 MED ORDER — TIZANIDINE HCL 4 MG PO TABS: 4.0000 mg | ORAL_TABLET | Freq: Two times a day (BID) | ORAL | Status: DC | PRN

## 2017-04-13 MED ORDER — DEXTROSE 5 % IV BOLUS
500.0000 mL | Freq: Once | INTRAVENOUS | Status: AC
  Administered 2017-04-13: 500 mL via INTRAVENOUS

## 2017-04-13 NOTE — ED Notes (Signed)
Attempted report 

## 2017-04-13 NOTE — ED Notes (Signed)
Notified pharmacy to verify and correct times of medications.

## 2017-04-13 NOTE — H&P (Signed)
History and Physical    Samuel Gallagher ZOX:[id number] DOB: November 13, 1955 DOA: 04/13/2017   PCP: [name], MD   Patient coming from:  Home    Chief Complaint: Abdominal and chest  pain   HPI: Samuel Gallagher is a 61 y.o. male with medical history significant for DI, chronic hypernatremia on Desmopressin, presenting to the ED, complaining of 3 day history of progressive chest pain radiating down to the abdomen, extending to the right flank, worse at the time of presentation. The pain does not change with movement. He denied any nausea vomiting or diaphoresis with the symptoms. He denies any other complaints. He denies any headaches. Of note, he had fallen and due to debilitation, a few days ago, without losing consciousness or hitting his head, around the areas of complaints.No confusion was noted by EDP.  ED Course:  BP (!) 103/53   Pulse (!) 52   Temp 98 F (36.7 C) (Oral)   Resp 18   SpO2 94%    CT of the abdomen and pelvis was performed, without findings in any acute abnormalities. Chest x-ray was normal Sodium was 155-the patient had not taking his morning medicine.   Review of Systems:  As per HPI otherwise all other systems reviewed and are negative  Past Medical History:  Diagnosis Date  . Acute kidney failure, unspecified (HCC)   . Diabetes insipidus (HCC)   . Diabetes insipidus (HCC)   . Gout   . HLD (hyperlipidemia)   . Hyperosmolality and/or hypernatremia 12/20/2016  . Hypotension, unspecified     Past Surgical History:  Procedure Laterality Date  . KNEE SURGERY      Social History Social History   Social History  . Marital status: Single    Spouse name: N/A  . Number of children: N/A  . Years of education: N/A   Occupational History  . Not on file.   Social History Main Topics  . Smoking status: Current Every Day Smoker  . Smokeless tobacco: Never Used  . Alcohol use No  . Drug use: No  . Sexual activity: Not on file   Other Topics Concern    . Not on file   Social History Narrative  . No narrative on file     No Known Allergies  History reviewed. No pertinent family history.    Prior to Admission medications   Medication Sig Start Date End Date Taking? Authorizing Provider  allopurinol (ZYLOPRIM) 100 MG tablet Take 100 mg by mouth daily.   Yes [provider]  desmopressin (DDAVP) 0.01 % SOLN Place 1 spray into the nose 2 (two) times daily.   Yes [provider]  diclofenac sodium (VOLTAREN) 1 % GEL Apply 4 g topically daily as needed (for pain).    Yes [provider]  rosuvastatin (CRESTOR) 10 MG tablet Take 10 mg by mouth daily.   Yes [provider]  tiZANidine (ZANAFLEX) 4 MG tablet Take 4 mg by mouth 2 (two) times daily as needed for muscle spasms.   Yes [provider]    Physical Exam:  Vitals:   04/13/17 1230 04/13/17 1345 04/13/17 1415 04/13/17 1500  BP: 106/66 (!) 107/56 (!) 101/48 (!) 103/53  Pulse: (!) 57 (!) 58 (!) 55 (!) 52  Resp: Temp:      TempSrc:      SpO2: 93% 91% 90% 94%   Constitutional: NAD, calm, comfortable  Eyes: PERRL, lids and conjunctivae normal ENMT: Mucous membranes are dry ,  without exudate or lesions . Edentulous Neck: normal, supple, no masses, no thyromegaly Respiratory: clear to auscultation bilaterally, no wheezing, no crackles. Normal respiratory effort  Cardiovascular: Regular rate and rhythm,  murmur, rubs or gallops. No extremity edema. 2+ pedal pulses. No carotid bruits.  Abdomen: Soft, non tender, No hepatosplenomegaly. Bowel sounds positive.  Musculoskeletal: no clubbing / cyanosis. Moves all extremities. Cannot reproduce pain  Skin: no jaundice, No lesions.  Neurologic: Sensation intact  Strength equal in all extremities Psychiatric:   Alert and oriented x 3. Normal mood.     Labs on Admission: I have personally reviewed following labs and imaging studies  CBC:  Recent Labs Lab 04/13/17 0845  WBC  5.6  HGB 13.3  HCT 44.2  MCV 93.1  PLT 216    Basic Metabolic Panel:  Recent Labs Lab 04/13/17 0845  NA 155*  K 3.6  CL 126*  CO2 23  GLUCOSE 168*  BUN 26*  CREATININE 1.83*  CALCIUM 10.2    GFR: CrCl cannot be calculated (Unknown ideal weight.).  Liver Function Tests: No results for input(s): AST, ALT, ALKPHOS, BILITOT, PROT, ALBUMIN in the last 168 hours. No results for input(s): LIPASE, AMYLASE in the last 168 hours. No results for input(s): AMMONIA in the last 168 hours.  Coagulation Profile: No results for input(s): INR, PROTIME in the last 168 hours.  Cardiac Enzymes: No results for input(s): CKTOTAL, CKMB, CKMBINDEX, TROPONINI in the last 168 hours.  BNP (last 3 results) No results for input(s): PROBNP in the last 8760 hours.  HbA1C: No results for input(s): HGBA1C in the last 72 hours.  CBG: No results for input(s): GLUCAP in the last 168 hours.  Lipid Profile: No results for input(s): CHOL, HDL, LDLCALC, TRIG, CHOLHDL, LDLDIRECT in the last 72 hours.  Thyroid Function Tests: No results for input(s): TSH, T4TOTAL, FREET4, T3FREE, THYROIDAB in the last 72 hours.  Anemia Panel: No results for input(s): VITAMINB12, FOLATE, FERRITIN, TIBC, IRON, RETICCTPCT in the last 72 hours.  Urine analysis:    Component Value Date/Time   COLORURINE YELLOW 04/13/2017 1530   APPEARANCEUR CLEAR 04/13/2017 1530   LABSPEC 1.025 04/13/2017 1530   PHURINE 5.0 04/13/2017 1530   GLUCOSEU NEGATIVE 04/13/2017 1530   HGBUR NEGATIVE 04/13/2017 1530   BILIRUBINUR NEGATIVE 04/13/2017 1530   KETONESUR NEGATIVE 04/13/2017 1530   PROTEINUR NEGATIVE 04/13/2017 1530   NITRITE NEGATIVE 04/13/2017 1530   LEUKOCYTESUR NEGATIVE 04/13/2017 1530    Sepsis Labs: (procalcitonin:4,lacticidven:4) )No results found for this or any previous visit (from the past 240 hour(s)).   Radiological Exams on Admission: Ct Abdomen Pelvis Wo Contrast  Result Date:  04/13/2017 CLINICAL DATA:  Right-sided abdominal pain for several days, initial encounter EXAM: CT ABDOMEN AND PELVIS WITHOUT CONTRAST TECHNIQUE: Multidetector CT imaging of the abdomen and pelvis was performed following the standard protocol without IV contrast. COMPARISON:  None. FINDINGS: Lower chest: Mild right basilar atelectasis is noted. Pacing device is noted. Hepatobiliary: No focal liver abnormality is seen. Status post cholecystectomy. No biliary dilatation. Pancreas: Unremarkable. No pancreatic ductal dilatation or surrounding inflammatory changes. Spleen: Normal in size without focal abnormality. Adrenals/Urinary Tract: The adrenal glands are within normal limits. No renal calculi or obstructive changes are noted. Scattered hypodense lesions are noted throughout both kidneys likely representing cysts although incompletely evaluated on this exam. The largest of these lies in the midportion of the right kidney measuring 4.2 cm. No obstructive changes are seen. The bladder is partially distended. Stomach/Bowel: Stomach is within normal  limits. Appendix appears normal. No evidence of bowel wall thickening, distention, or inflammatory changes. Vascular/Lymphatic: Aortic atherosclerosis. No enlarged abdominal or pelvic lymph nodes. Reproductive: Choose Other: No abdominal wall hernia or abnormality. No abdominopelvic ascites. Musculoskeletal: No acute or significant osseous findings. IMPRESSION: Scattered hypodensities within the kidneys likely representing cysts. No other focal abnormality is noted. Electronically Signed   By: Alcide Clever M.D.   On: 04/13/2017 13:28   Dg Chest 2 View  Result Date: 04/13/2017 CLINICAL DATA:  Chest pain and decreased energy EXAM: CHEST  2 VIEW COMPARISON:  12/21/2016 FINDINGS: Cardiac shadow is stable. Two lead pacing device is again noted and stable. The lungs are mildly hyperinflated without focal infiltrate or sizable effusion. Mild degenerative changes of the thoracic  spine are seen. IMPRESSION: Mild COPD without acute abnormality. Electronically Signed   By: Alcide Clever M.D.   On: 04/13/2017 09:30    EKG: Independently reviewed.  Assessment/Plan Active Problems:   Hypernatremia   Diabetes insipidus (HCC)   Chronic gout   Pacemaker   Anemia of chronic disease   DDD (degenerative disc disease), lumbar   Symptomatic Hypernatremia in a patient with DI, on chronic desmopressin, compliant with med admitted with generalized abdominal pain, fatigued.  He has chronic polyuria, denies thirst. No confusion. His symptoms are now improved.  - sodium  155 (unknown BL but in the past has been as high as 177) Cr 1.83  on admission received 500 cc D5 bolus at the ED Tele Obs Urine osmolality, U sodium and S. Osmolality Continue Desmopressin Recheck BMET q 6 to not drop Na more than 10 meq over 24 h  ns 500 cc x1 and 1/2 1/2 NS at 75 cc/h    Abdominal and CP, likely due to above, recent falls to to increased debility. EKG and Tn neg.CXR unremarkable.CT abdomen without acute findings. UA, Hepatitis panel and Lipase pending. Patient is pain free at this time  Await for above labs results, continue to monitor. Pain and Antiemetic control Continue ASA Tn and EKG prn  PT/OT for eval debility  Acute elevation of Creatinine in view of dehydration in the setting of DI baseline creatinine 1.4, current 1.83  Lab Results  Component Value Date   CREATININE 1.83 (H) 04/13/2017   CREATININE 1.4 (A) 12/26/2016   CREATININE 1.39 (H) 12/24/2016  IVF CMET   Hyperlipidemia Continue home statins   DVT prophylaxis: Heparin  Code Status:   Full code Family Communication:  Discussed with patient Disposition Plan: Expect patient to be discharged to home after condition improves Consults called:   None  Admission status: Obs tele   Jaislyn Blinn E, PA-C [facility]   04/13/2017, 4:20 PM

## 2017-04-13 NOTE — ED Notes (Signed)
CT notified pt finished 1st bottle of contrast drink, unable to tolerate 2nd bottle of contrast.

## 2017-04-13 NOTE — ED Provider Notes (Signed)
[facility]-EMERGENCY DEPT Provider Note   CSN: [id number] Arrival date & time: 04/13/17  0820     History   Chief Complaint Chief Complaint  Patient presents with  . Abdominal Pain  . Back Pain  . Testicle Pain  . Chest Pain    HPI Desmen Schoffstall is a 61 y.o. male.  HPI Pt started having aching all over a few days ago.     Pt did have a fall prior to the pain starting .  He tripped and landed on the floor a few days before the pain started and then he started aching.  THe pain is in the chest, stomach, sides and back.  NO nausea vomiting or diarrhea.  No shortness of breath, fevers or cough.  Past Medical History:  Diagnosis Date  . Acute kidney failure, unspecified (HCC)   . Diabetes insipidus (HCC)   . Diabetes insipidus (HCC)   . Gout   . HLD (hyperlipidemia)   . Hyperosmolality and/or hypernatremia 12/20/2016  . Hypotension, unspecified     Patient Active Problem List   Diagnosis Date Noted  . Abdominal pain   . Low serum vitamin B12 01/17/2017  . DDD (degenerative disc disease), lumbar 01/17/2017  . Diabetes insipidus (HCC) 12/25/2016  . Chronic gout 12/25/2016  . SSS (sick sinus syndrome) (HCC) 12/25/2016  . Pacemaker 12/25/2016  . Anemia of chronic disease 12/25/2016  . Thrombocytopenia (HCC) 12/25/2016  . Hypernatremia 12/20/2016  . AKI (acute kidney injury) (HCC) 12/20/2016    Past Surgical History:  Procedure Laterality Date  . KNEE SURGERY         Home Medications    Prior to Admission medications   Medication Sig Start Date End Date Taking? Authorizing Provider  allopurinol (ZYLOPRIM) 100 MG tablet Take 100 mg by mouth daily.   Yes [provider]  desmopressin (DDAVP) 0.01 % SOLN Place 1 spray into the nose 2 (two) times daily.   Yes [provider]  diclofenac sodium (VOLTAREN) 1 % GEL Apply 4 g topically daily as needed (for pain).    Yes [provider]  rosuvastatin (CRESTOR) 10 MG tablet Take 10 mg by mouth daily.    Yes [provider]  tiZANidine (ZANAFLEX) 4 MG tablet Take 4 mg by mouth 2 (two) times daily as needed for muscle spasms.   Yes [provider]  cyclobenzaprine (FLEXERIL) 5 MG tablet Take 1 tablet (5 mg total) by mouth 3 (three) times daily as needed for muscle spasms. 04/15/17   Arrien, York Ram, MD    Family History History reviewed. No pertinent family history.  Social History Social History  Substance Use Topics  . Smoking status: Current Every Day Smoker  . Smokeless tobacco: Never Used  . Alcohol use No     Allergies   Patient has no known allergies.   Review of Systems Review of Systems  Constitutional: Negative for fever.  Respiratory: Negative for shortness of breath.   Gastrointestinal:       Appetite is fine  All other systems reviewed and are negative.    Physical Exam Updated Vital Signs BP (!) 95/41 (BP Location: Right Arm)   Pulse 71   Temp 98.2 F (36.8 C) (Oral)   Resp 16   Ht 1.702 m ( )   Wt 83 kg (182 lb 15.7 oz)   SpO2 95%   BMI 28.66 kg/m   Physical Exam  Constitutional: He appears well-developed and well-nourished. No distress.  HENT:  Head: Normocephalic  and atraumatic.  Right Ear: External ear normal.  Left Ear: External ear normal.  Eyes: Conjunctivae are normal. Right eye exhibits no discharge. Left eye exhibits no discharge. No scleral icterus.  Neck: Neck supple. No tracheal deviation present.  Cardiovascular: Normal rate, regular rhythm and intact distal pulses.   Pulmonary/Chest: Effort normal and breath sounds normal. No stridor. No respiratory distress. He has no wheezes. He has no rales.  Abdominal: Soft. Bowel sounds are normal. He exhibits no distension. There is generalized tenderness. There is no rebound and no guarding.  Musculoskeletal: He exhibits no edema or tenderness.  Neurological: He is alert. He has normal strength. No cranial nerve deficit (no facial droop, extraocular movements  intact, no slurred speech) or sensory deficit. He exhibits normal muscle tone. He displays no seizure activity. Coordination normal.  Skin: Skin is warm and dry. No rash noted.  Psychiatric: He has a normal mood and affect.  Nursing note and vitals reviewed.    ED Treatments / Results  Labs (all labs ordered are listed, but only abnormal results are displayed) Labs Reviewed  BASIC METABOLIC PANEL - Abnormal; Notable for the following:       Result Value   Sodium 155 (*)    Chloride 126 (*)    Glucose, Bld 168 (*)    BUN 26 (*)    Creatinine, Ser 1.83 (*)    GFR calc non Af Amer 38 (*)    GFR calc Af Amer 44 (*)    All other components within normal limits  HEPATIC FUNCTION PANEL - Abnormal; Notable for the following:    Albumin 2.9 (*)    AST 366 (*)    ALT 188 (*)    Alkaline Phosphatase 181 (*)    Bilirubin, Direct <0.1 (*)    All other components within normal limits  LIPASE, BLOOD - Abnormal; Notable for the following:    Lipase 52 (*)    All other components within normal limits  OSMOLALITY - Abnormal; Notable for the following:    Osmolality 335 (*)    All other components within normal limits  BASIC METABOLIC PANEL - Abnormal; Notable for the following:    Sodium 151 (*)    Chloride 118 (*)    Glucose, Bld 114 (*)    BUN 23 (*)    Creatinine, Ser 1.79 (*)    GFR calc non Af Amer 39 (*)    GFR calc Af Amer 45 (*)    All other components within normal limits  COMPREHENSIVE METABOLIC PANEL - Abnormal; Notable for the following:    Sodium 153 (*)    Chloride 121 (*)    Glucose, Bld 104 (*)    BUN 24 (*)    Creatinine, Ser 1.66 (*)    Total Protein 6.4 (*)    Albumin 2.6 (*)    AST 159 (*)    ALT 137 (*)    Alkaline Phosphatase 166 (*)    GFR calc non Af Amer 43 (*)    GFR calc Af Amer 50 (*)    All other components within normal limits  CBC - Abnormal; Notable for the following:    RBC 4.05 (*)    Hemoglobin 11.2 (*)    HCT 37.5 (*)    MCHC 29.9 (*)     All other components within normal limits  BASIC METABOLIC PANEL - Abnormal; Notable for the following:    Sodium 150 (*)    Chloride 119 (*)  Glucose, Bld 120 (*)    BUN 21 (*)    Creatinine, Ser 1.77 (*)    GFR calc non Af Amer 40 (*)    GFR calc Af Amer 46 (*)    Anion gap 3 (*)    All other components within normal limits  CBC  URINALYSIS, ROUTINE W REFLEX MICROSCOPIC  OSMOLALITY, URINE  SODIUM, URINE, RANDOM  I-STAT TROPONIN, ED    EKG  EKG Interpretation  Date/Time:  Monday April 13 2017 08:39:18 EDT Ventricular Rate:  90 PR Interval:  156 QRS Duration: 96 QT Interval:  360 QTC Calculation: 440 R Axis:   -68 Text Interpretation:  Sinus rhythm with frequent Premature ventricular complexes Left axis deviation Abnormal ECG atiral pacing not noted today compared to prior ECG Confirmed by Linwood Dibbles [phone]) on 04/13/2017 10:04:35 AM       Radiology X-ray Chest Pa And Lateral  Result Date: 04/14/2017 CLINICAL DATA:  Hyper for hypernatremia EXAM: CHEST  2 VIEW COMPARISON:  Chest x-ray of 04/13/2017 FINDINGS: Bibasilar linear atelectasis and possible scarring is noted bilaterally. No pneumonia or effusion is seen. Mediastinal and hilar contours are unchanged and a dual lead permanent pacemaker remains. No acute bony abnormality is seen. IMPRESSION: Bibasilar linear atelectasis with some linear scarring present. No definite active process. Electronically Signed   By: Dwyane Dee M.D.   On: 04/14/2017 08:54    Procedures Procedures (including critical care time)  Medications Ordered in ED Medications  dextrose 5 % bolus 500 mL (0 mLs Intravenous Stopped 04/13/17 1255)  morphine 4 MG/ML injection 4 mg (4 mg Intravenous Given 04/13/17 1035)     Initial Impression / Assessment and Plan / ED Course  I have reviewed the triage vital signs and the nursing notes.  Pertinent labs & imaging results that were available during my care of the patient were reviewed by me and  considered in my medical decision making (see chart for details).   Pt presented to the ED with abdominal pain.  Unclear etiology.  CT scan ordered to evaluate further.  No acute findings on CT.  Pt noted to be hypernatremic.  Similar sx in the past.  Started on d5w for the hypernatremia.  Admitted for further treatment  Final Clinical Impressions(s) / ED Diagnoses   Final diagnoses:  Hypernatremia  Abdominal pain, unspecified abdominal location      Linwood Dibbles, MD 04/16/17 [phone]

## 2017-04-13 NOTE — ED Notes (Signed)
Pt given turkey sandwich and apple juice

## 2017-04-13 NOTE — ED Notes (Signed)
Got patient undress on the monitor patient is resting with family at bedside and call bell in reach 

## 2017-04-13 NOTE — ED Notes (Addendum)
Pt called for X-ray X2

## 2017-04-13 NOTE — ED Notes (Signed)
Pt c/o pain in stomach, KNapp MD made aware.

## 2017-04-13 NOTE — ED Triage Notes (Signed)
To ED for eval of cp that radiates down to abd, around to right flank, and down to right testicle. States it started 3 days ago and has become worse. States he was watching TV when it started. Never completely goes away but does ease when sitting down. Pt denies nausea, vomiting, or diaphoresis. No pulsating masses noted with observation of abd while sitting back in triage chair. Pulses palpable, strong, and equal left/right.

## 2017-04-13 NOTE — ED Notes (Signed)
Patient transported to CT 

## 2017-04-13 NOTE — ED Notes (Signed)
Patient transported to X-ray 

## 2017-04-14 ENCOUNTER — Observation Stay (HOSPITAL_COMMUNITY): Payer: Medicaid Other

## 2017-04-14 DIAGNOSIS — E870 Hyperosmolality and hypernatremia: Secondary | ICD-10-CM | POA: Diagnosis not present

## 2017-04-14 DIAGNOSIS — F17200 Nicotine dependence, unspecified, uncomplicated: Secondary | ICD-10-CM | POA: Diagnosis present

## 2017-04-14 DIAGNOSIS — E232 Diabetes insipidus: Secondary | ICD-10-CM | POA: Diagnosis present

## 2017-04-14 DIAGNOSIS — M5136 Other intervertebral disc degeneration, lumbar region: Secondary | ICD-10-CM | POA: Diagnosis present

## 2017-04-14 DIAGNOSIS — E785 Hyperlipidemia, unspecified: Secondary | ICD-10-CM | POA: Diagnosis present

## 2017-04-14 DIAGNOSIS — N179 Acute kidney failure, unspecified: Secondary | ICD-10-CM | POA: Diagnosis present

## 2017-04-14 DIAGNOSIS — Z950 Presence of cardiac pacemaker: Secondary | ICD-10-CM | POA: Diagnosis not present

## 2017-04-14 DIAGNOSIS — M1A9XX0 Chronic gout, unspecified, without tophus (tophi): Secondary | ICD-10-CM | POA: Diagnosis present

## 2017-04-14 DIAGNOSIS — S39012A Strain of muscle, fascia and tendon of lower back, initial encounter: Secondary | ICD-10-CM | POA: Diagnosis present

## 2017-04-14 DIAGNOSIS — R109 Unspecified abdominal pain: Secondary | ICD-10-CM | POA: Diagnosis present

## 2017-04-14 DIAGNOSIS — I959 Hypotension, unspecified: Secondary | ICD-10-CM | POA: Diagnosis present

## 2017-04-14 DIAGNOSIS — R739 Hyperglycemia, unspecified: Secondary | ICD-10-CM | POA: Diagnosis present

## 2017-04-14 DIAGNOSIS — E860 Dehydration: Secondary | ICD-10-CM | POA: Diagnosis present

## 2017-04-14 DIAGNOSIS — X58XXXA Exposure to other specified factors, initial encounter: Secondary | ICD-10-CM | POA: Diagnosis present

## 2017-04-14 DIAGNOSIS — D638 Anemia in other chronic diseases classified elsewhere: Secondary | ICD-10-CM | POA: Diagnosis present

## 2017-04-14 DIAGNOSIS — N183 Chronic kidney disease, stage 3 (moderate): Secondary | ICD-10-CM | POA: Diagnosis present

## 2017-04-14 DIAGNOSIS — M545 Low back pain: Secondary | ICD-10-CM | POA: Diagnosis not present

## 2017-04-14 LAB — COMPREHENSIVE METABOLIC PANEL
ALBUMIN: 2.6 g/dL — AB (ref 3.5–5.0)
ALT: 137 U/L — ABNORMAL HIGH (ref 17–63)
AST: 159 U/L — AB (ref 15–41)
Alkaline Phosphatase: 166 U/L — ABNORMAL HIGH (ref 38–126)
Anion gap: 5 (ref 5–15)
BUN: 24 mg/dL — AB (ref 6–20)
CO2: 27 mmol/L (ref 22–32)
Calcium: 9.5 mg/dL (ref 8.9–10.3)
Chloride: 121 mmol/L — ABNORMAL HIGH (ref 101–111)
Creatinine, Ser: 1.66 mg/dL — ABNORMAL HIGH (ref 0.61–1.24)
GFR calc Af Amer: 50 mL/min — ABNORMAL LOW (ref >60)
GFR calc non Af Amer: 43 mL/min — ABNORMAL LOW (ref >60)
GLUCOSE: 104 mg/dL — AB (ref 65–99)
POTASSIUM: 3.8 mmol/L (ref 3.5–5.1)
Sodium: 153 mmol/L — ABNORMAL HIGH (ref 135–145)
Total Bilirubin: 0.3 mg/dL (ref 0.3–1.2)
Total Protein: 6.4 g/dL — ABNORMAL LOW (ref 6.5–8.1)

## 2017-04-14 LAB — CBC
HCT: 37.5 % — ABNORMAL LOW (ref 39.0–52.0)
Hemoglobin: 11.2 g/dL — ABNORMAL LOW (ref 13.0–17.0)
MCH: 27.7 pg (ref 26.0–34.0)
MCHC: 29.9 g/dL — AB (ref 30.0–36.0)
MCV: 92.6 fL (ref 78.0–100.0)
Platelets: 190 10*3/uL (ref 150–400)
RBC: 4.05 MIL/uL — ABNORMAL LOW (ref 4.22–5.81)
RDW: 15 % (ref 11.5–15.5)
WBC: 4.6 10*3/uL (ref 4.0–10.5)

## 2017-04-14 MED ORDER — CYCLOBENZAPRINE HCL 5 MG PO TABS
5.0000 mg | ORAL_TABLET | Freq: Three times a day (TID) | ORAL | Status: DC | PRN
  Administered 2017-04-14 – 2017-04-15 (×3): 5 mg via ORAL
  Filled 2017-04-14 (×3): qty 1

## 2017-04-14 NOTE — Progress Notes (Signed)
I saw and evaluated the patient, I personally obtain the key portions of the history and physical exam, I reviewed with the physician assistant student documentation and agree with the physician assistant student medical decision-making. Further assessment and plan are as follows:  Please see attending's daily progress note.  Samuel Gallagher M.D.  PROGRESS NOTE  Samuel Gallagher WUJ:[id number] DOB: 20-Jul-1956 DOA: 04/13/2017 PCP: Samuel Contras, MD   LOS: 0 days   Brief Narrative / Interim history: Samuel Gallagher is a 61 year old male with a history significant for central diabetes insipidus, chronic kidney disease, gout, and sick sinus syndrome s/p pacemaker.  He presented to ED 04/13/17 with 3 days of worsening chest pain and abdominal pain that radiated to right flank.  In ED he had a CT abdomen/pelvis with no acute findings and his chest xray was normal.  His Sodium was elevated to 155 and patient reported not taking his morning medication.  On exam patient was found to be hypotensive and bradycardic with dry mucous membranes.    Assessment & Plan: Active Problems:   Hypernatremia   Diabetes insipidus (HCC)   Chronic gout   Pacemaker   Anemia of chronic disease   DDD (degenerative disc disease), lumbar   Abdominal pain   1. Diabetes Insipidus with Hypernatremia - Sodium 155 on initial presentation, decreased to 151 by last night and was 153 this AM - Due to poor compliance with home medication - patient reports he ran out of his Desmopressin - Restart Desmopressin 0.01% nasal spray BID - Discontinue 1/2 NS infusion due to slight rise in serum sodium - Continue to monitor Sodium drop with BMP every 6 hours  2. Back pain due to right low back strain - Right paraspinal muscle is tender and pain is worsened with movement - Mechanism of injury unclear but may be related to one of patient's recent falls - Continue pain control with Tylenol 650 mg PRN when mild and Vicodin 5-325  mg PRN when moderate - Discontinue Tizanidine; Start Flexeril 5 mg TID PRN for muscle spasm - Consult PT to evaluate  3. Acute Kidney Injury on Chronic Kidney Disease Stage 2 - Baseline creatinine 1.4; Improved from 1.79 last night to 1.66 today - Possible in setting of dehydration from polyuria associated with DI - Hold IVF hydration at this time with increasing serum sodium levels - Continue to monitor kidney function daily on CMP  DVT prophylaxis: heparin Code Status: FULL Family Communication: No family at bedside Disposition Plan: Home  Consultants:   PT   Subjective: The patient states his back is what is bothering him the most today and he still suffers from some abdominal pain.  His back pain is worse on the right, began 3 days ago and there is no clear mechanism of injury.  The pain is rated a 10/10, aggravated with movement and relieved when sitting still.  His abdominal pain is diffuse and not associated with nausea, vomiting or diarrhea.  The patient denies chest pain, headache, confusion and polydipsia.  He also denies dysuria and hematuria, but states he has experienced polyuria.    Objective: Vitals:   04/13/17 2224 04/13/17 2226 04/14/17 0500 04/14/17 0558  BP: (!) 91/45 (!) 97/52  (!) 98/45  Pulse: 84 79  (!) 59  Resp: Temp:    98.7 F (37.1 C)  TempSrc:    Oral  SpO2: 94% 97%  96%  Weight:   82.3 kg (181 lb 7  oz)   Height:        Intake/Output Summary (Last 24 hours) at 04/14/17 1301 Last data filed at 04/14/17 1058  Gross per 24 hour  Intake           748.75 ml  Output              200 ml  Net           548.75 ml   Filed Weights   04/13/17 1846 04/14/17 0500  Weight: 82.9 kg (182 lb 12.2 oz) 82.3 kg (181 lb 7 oz)    Examination:  Constitutional: Patient sleeping in bed upon entering the room, appears somewhat comfortable ENT: mucous membranes moist, no sclera icterus, pupils equal and round Respiratory: Clear to auscultation  bilaterally; no wheezing, crackles or rhonchi; no signs of respiratory distress. Cardiovascular: regular rhythm, mildly bradycardic; S1 & S2 present with no murmurs heard; distal pulses intact, no pitting edema;  Subcutaneous pacemaker visible over left chest Abdomen: diffuse tenderness but otherwise soft and non-distended with no guarding or rigidity Musculoskeletal: tenderness to palpation in paraspinal muscle over right low back and some spasm is noted; patient appears to be moving well but states it is painful.  Psychiatric: Appropriate mood and affect; cooperative with exam and questioning; Alert and oriented X3   Data Reviewed: I have independently reviewed following labs and imaging studies  CBC:  Recent Labs Lab 04/13/17 0845 04/14/17 0543  WBC 5.6 4.6  HGB 13.3 11.2*  HCT 44.2 37.5*  MCV 93.1 92.6  PLT 216 190   Basic Metabolic Panel:  Recent Labs Lab 04/13/17 0845 04/13/17 2219 04/14/17 0543  NA 155* 151* 153*  K 3.6 4.2 3.8  CL 126* 118* 121*  CO2 GLUCOSE 168* 114* 104*  BUN 26* 23* 24*  CREATININE 1.83* 1.79* 1.66*  CALCIUM 10.2 9.8 9.5   GFR: Estimated Creatinine Clearance: 48 mL/min (A) (by C-G formula based on SCr of 1.66 mg/dL (H)). Liver Function Tests:  Recent Labs Lab 04/13/17 1719 04/14/17 0543  AST 366* 159*  ALT 188* 137*  ALKPHOS 181* 166*  BILITOT 0.6 0.3  PROT 7.4 6.4*  ALBUMIN 2.9* 2.6*    Recent Labs Lab 04/13/17 1719  LIPASE 52*   No results for input(s): AMMONIA in the last 168 hours. Coagulation Profile: No results for input(s): INR, PROTIME in the last 168 hours. Cardiac Enzymes: No results for input(s): CKTOTAL, CKMB, CKMBINDEX, TROPONINI in the last 168 hours. BNP (last 3 results) No results for input(s): PROBNP in the last 8760 hours. HbA1C: No results for input(s): HGBA1C in the last 72 hours. CBG: No results for input(s): GLUCAP in the last 168 hours. Lipid Profile: No results for input(s): CHOL, HDL,  LDLCALC, TRIG, CHOLHDL, LDLDIRECT in the last 72 hours. Thyroid Function Tests: No results for input(s): TSH, T4TOTAL, FREET4, T3FREE, THYROIDAB in the last 72 hours. Anemia Panel: No results for input(s): VITAMINB12, FOLATE, FERRITIN, TIBC, IRON, RETICCTPCT in the last 72 hours. Urine analysis:    Component Value Date/Time   COLORURINE YELLOW 04/13/2017 1530   APPEARANCEUR CLEAR 04/13/2017 1530   LABSPEC 1.025 04/13/2017 1530   PHURINE 5.0 04/13/2017 1530   GLUCOSEU NEGATIVE 04/13/2017 1530   HGBUR NEGATIVE 04/13/2017 1530   BILIRUBINUR NEGATIVE 04/13/2017 1530   KETONESUR NEGATIVE 04/13/2017 1530   PROTEINUR NEGATIVE 04/13/2017 1530   NITRITE NEGATIVE 04/13/2017 1530   LEUKOCYTESUR NEGATIVE 04/13/2017 1530   Sepsis Labs: Invalid input(s): PROCALCITONIN, LACTICIDVEN  No results  found for this or any previous visit (from the past 240 hour(s)).    Radiology Studies: Ct Abdomen Pelvis Wo Contrast  Result Date: 04/13/2017 CLINICAL DATA:  Right-sided abdominal pain for several days, initial encounter EXAM: CT ABDOMEN AND PELVIS WITHOUT CONTRAST TECHNIQUE: Multidetector CT imaging of the abdomen and pelvis was performed following the standard protocol without IV contrast. COMPARISON:  None. FINDINGS: Lower chest: Mild right basilar atelectasis is noted. Pacing device is noted. Hepatobiliary: No focal liver abnormality is seen. Status post cholecystectomy. No biliary dilatation. Pancreas: Unremarkable. No pancreatic ductal dilatation or surrounding inflammatory changes. Spleen: Normal in size without focal abnormality. Adrenals/Urinary Tract: The adrenal glands are within normal limits. No renal calculi or obstructive changes are noted. Scattered hypodense lesions are noted throughout both kidneys likely representing cysts although incompletely evaluated on this exam. The largest of these lies in the midportion of the right kidney measuring 4.2 cm. No obstructive changes are seen. The bladder  is partially distended. Stomach/Bowel: Stomach is within normal limits. Appendix appears normal. No evidence of bowel wall thickening, distention, or inflammatory changes. Vascular/Lymphatic: Aortic atherosclerosis. No enlarged abdominal or pelvic lymph nodes. Reproductive: Choose Other: No abdominal wall hernia or abnormality. No abdominopelvic ascites. Musculoskeletal: No acute or significant osseous findings. IMPRESSION: Scattered hypodensities within the kidneys likely representing cysts. No other focal abnormality is noted. Electronically Signed   By: Alcide Clever M.D.   On: 04/13/2017 13:28   X-ray Chest Pa And Lateral  Result Date: 04/14/2017 CLINICAL DATA:  Hyper for hypernatremia EXAM: CHEST  2 VIEW COMPARISON:  Chest x-ray of 04/13/2017 FINDINGS: Bibasilar linear atelectasis and possible scarring is noted bilaterally. No pneumonia or effusion is seen. Mediastinal and hilar contours are unchanged and a dual lead permanent pacemaker remains. No acute bony abnormality is seen. IMPRESSION: Bibasilar linear atelectasis with some linear scarring present. No definite active process. Electronically Signed   By: Dwyane Dee M.D.   On: 04/14/2017 08:54   Dg Chest 2 View  Result Date: 04/13/2017 CLINICAL DATA:  Chest pain and decreased energy EXAM: CHEST  2 VIEW COMPARISON:  12/21/2016 FINDINGS: Cardiac shadow is stable. Two lead pacing device is again noted and stable. The lungs are mildly hyperinflated without focal infiltrate or sizable effusion. Mild degenerative changes of the thoracic spine are seen. IMPRESSION: Mild COPD without acute abnormality. Electronically Signed   By: Alcide Clever M.D.   On: 04/13/2017 09:30     Scheduled Meds: . allopurinol  100 mg Oral Daily  . desmopressin  1 spray Nasal BID  . heparin  5,000 Units Subcutaneous Q8H  . rosuvastatin  10 mg Oral Daily   Continuous Infusions: . sodium chloride       Shelbie Proctor, PA-Student [facility] 04/14/2017, 1:01 PM

## 2017-04-14 NOTE — Progress Notes (Signed)
PROGRESS NOTE    Samuel Gallagher  JWJ:[id number] DOB: 1956/03/07 DOA: 04/13/2017 PCP: [name], MD    Brief Narrative:  61 year old male presented with abdominal and chest pain. Patient does have the significant history of central diabetes insipidus. Patient complaining of three-day history of back pain, radiating to his abdomen, moderate to severe intensity, and persistent. Patient admits missing his desmopressin doses. On his initial physical examination his blood pressure was 130/53, heart rate 52, temperature 98, respiratory rate 18, oxygen saturation 94%. Dry mucous membranes, lungs were clear to auscultation bilaterally, no wheezing, rales or rhonchi, heart S1-S2 present and rhythmic, no gallops, rubs or murmurs, abdomen was soft nontender, no lower extremity edema. Patient was awake and alert, nonfocal. Sodium 155, potassium 3.6, chloride 126, bicarbonate 23, glucose 168, BUN 26, creatinine 1.8, white count 5.6, hemoglobin 13.3, hematocrit 44.2, platelets 260. Urinalysis negative for infection. Abdominal CT no acute changes. Chest x-ray, good inflation, good penetration, no significant rotation, pacemaker in place, no infiltrates, effusions or pneumothorax. EKG sinus rhythm, left axis deviation, frequent premature ventricular complexes.  Patient was admitted to hospital with working diagnosis of hypernatremia due to uncontrolled diabetes insipidus.   Assessment & Plan:   Active Problems:   Hypernatremia   Diabetes insipidus (HCC)   Chronic gout   Pacemaker   Anemia of chronic disease   DDD (degenerative disc disease), lumbar   Abdominal pain   1. Central diabetes insipidus. Will resume vasopressin, will hold on IV fluids, and will continue to follow electrolytes in am.   2, AKI on stage 2 CKD. Responded well to IV fluids, patient tolerating po well, will hold on further half normal saline and will check renal panel in am, avoid hypotension or nephrotoxic medications.   3.  Acute back pain. Will continue hydrocodone and will add flexeril  4. Gout. Continue with allopurinol.   DVT prophylaxis: enoxaparin  Code Status: Full Family Communication: No family at the bedside Disposition Plan: Home   Consultants:     Procedures:     Antimicrobials:       Subjective: Patient with back pain, moderate in intensity, worse with movement, and improved with analgesics, no radiation or associated symptoms.   Objective: Vitals:   04/13/17 2224 04/13/17 2226 04/14/17 0500 04/14/17 0558  BP: (!) 91/45 (!) 97/52  (!) 98/45  Pulse: 84 79  (!) 59  Resp: Temp:    98.7 F (37.1 C)  TempSrc:    Oral  SpO2: 94% 97%  96%  Weight:   82.3 kg (181 lb 7 oz)   Height:        Intake/Output Summary (Last 24 hours) at 04/14/17 1253 Last data filed at 04/14/17 1058  Gross per 24 hour  Intake          1248.75 ml  Output              200 ml  Net          1048.75 ml   Filed Weights   04/13/17 1846 04/14/17 0500  Weight: 82.9 kg (182 lb 12.2 oz) 82.3 kg (181 lb 7 oz)    Examination:  General: deconditioned Neurology: Awake and alert, non focal  E ENT: no pallor, no icterus, oral mucosa moist Cardiovascular: No JVD. S1-S2 present, rhythmic, no gallops, rubs, or murmurs. No lower extremity edema. Pulmonary: vesicular breath sounds bilaterally, adequate air movement, no wheezing, rhonchi or rales. Gastrointestinal. Abdomen flat, no organomegaly, non tender, no rebound or  guarding Skin. No rashes Musculoskeletal: no joint deformities, positive tenderness to paraspinous muscle palpation on the right.      Data Reviewed: I have personally reviewed following labs and imaging studies  CBC:  Recent Labs Lab 04/13/17 0845 04/14/17 0543  WBC 5.6 4.6  HGB 13.3 11.2*  HCT 44.2 37.5*  MCV 93.1 92.6  PLT 216 190   Basic Metabolic Panel:  Recent Labs Lab 04/13/17 0845 04/13/17 2219 04/14/17 0543  NA 155* 151* 153*  K 3.6 4.2 3.8  CL 126* 118*  121*  CO2 GLUCOSE 168* 114* 104*  BUN 26* 23* 24*  CREATININE 1.83* 1.79* 1.66*  CALCIUM 10.2 9.8 9.5   GFR: Estimated Creatinine Clearance: 48 mL/min (A) (by C-G formula based on SCr of 1.66 mg/dL (H)). Liver Function Tests:  Recent Labs Lab 04/13/17 1719 04/14/17 0543  AST 366* 159*  ALT 188* 137*  ALKPHOS 181* 166*  BILITOT 0.6 0.3  PROT 7.4 6.4*  ALBUMIN 2.9* 2.6*    Recent Labs Lab 04/13/17 1719  LIPASE 52*   No results for input(s): AMMONIA in the last 168 hours. Coagulation Profile: No results for input(s): INR, PROTIME in the last 168 hours. Cardiac Enzymes: No results for input(s): CKTOTAL, CKMB, CKMBINDEX, TROPONINI in the last 168 hours. BNP (last 3 results) No results for input(s): PROBNP in the last 8760 hours. HbA1C: No results for input(s): HGBA1C in the last 72 hours. CBG: No results for input(s): GLUCAP in the last 168 hours. Lipid Profile: No results for input(s): CHOL, HDL, LDLCALC, TRIG, CHOLHDL, LDLDIRECT in the last 72 hours. Thyroid Function Tests: No results for input(s): TSH, T4TOTAL, FREET4, T3FREE, THYROIDAB in the last 72 hours. Anemia Panel: No results for input(s): VITAMINB12, FOLATE, FERRITIN, TIBC, IRON, RETICCTPCT in the last 72 hours.    Radiology Studies: I have reviewed all of the imaging during this hospital visit personally     Scheduled Meds: . allopurinol  100 mg Oral Daily  . desmopressin  1 spray Nasal BID  . heparin  5,000 Units Subcutaneous Q8H  . rosuvastatin  10 mg Oral Daily   Continuous Infusions: . sodium chloride       LOS: 0 days        Mauricio Annett Gula, MD [facility] Pager [phone]

## 2017-04-14 NOTE — Evaluation (Signed)
Physical Therapy Evaluation Patient Details Name: Samuel Gallagher MRN: [id number] DOB: 23-Feb-1956 Today's Date: 04/14/2017   History of Present Illness  Patient is a 61 y/o male who presents with chest pain and abdominal pain that radiated to right flank. Found to have hypernatremia. CT abdomen/pelvis with no acute findings and his chest xray was normalPMH includes DM, gout, CKD, SSS s/p pacemaker, hypotension, HLD.   Clinical Impression  Patient presents with generalized weakness, deconditioning, back pain, impaired balance and impaired mobility s/p above. Tolerated gait training with Min A for balance/safety. Recommend use of RW for support at home as pt with poor safety awareness, reports multiple falls at home and has right knee that "gives out" frequently. Lives at home with brother. Will follow acutely to maximize independence and mobility prior to return home.     Follow Up Recommendations Home health PT;Supervision for mobility/OOB    Equipment Recommendations  Rolling walker with 5" wheels    Recommendations for Other Services       Precautions / Restrictions Precautions Precautions: Fall Restrictions Weight Bearing Restrictions: No      Mobility  Bed Mobility Overal bed mobility: Modified Independent                Transfers Overall transfer level: Needs assistance Equipment used: Rolling walker (2 wheeled) Transfers: Sit to/from Stand Sit to Stand: Min guard         General transfer comment: Min guard for safety. Slow to stand. Stood from [employer], transferred to chair post ambulation.  Ambulation/Gait Ambulation/Gait assistance: Min assist Ambulation Distance (Feet): 120 Feet Assistive device: Rolling walker (2 wheeled) Gait Pattern/deviations: Step-through pattern;Decreased stride length;Decreased stance time - right;Trunk flexed Gait velocity: decreased   General Gait Details: Slow, unsteady gait with RW for support; right knee instability and  partial buckling towards end of ambulation. NO dizziness. Difficulty with RW navigation in tight spaces.  Stairs            Wheelchair Mobility    Modified Rankin (Stroke Patients Only)       Balance Overall balance assessment: Needs assistance;History of Falls Sitting-balance support: Feet supported;No upper extremity supported Sitting balance-Leahy Scale: Fair     Standing balance support: During functional activity;Bilateral upper extremity supported Standing balance-Leahy Scale: Poor Standing balance comment: Reliant on UEs for support in standing.                             Pertinent Vitals/Pain Pain Assessment: 0-10 Pain Score: 7  Pain Location: back  Pain Descriptors / Indicators: Sore;Aching Pain Intervention(s): Monitored during session;Repositioned;RN gave pain meds during session;Limited activity within patient's tolerance    Home Living Family/patient expects to be discharged to:: Private residence Living Arrangements: Other relatives (brother) Available Help at Discharge: Family;Available 24 hours/day Type of Home: House Home Access: Level entry     Home Layout: One level Home Equipment: Grab bars - tub/shower;Shower seat      Prior Function Level of Independence: Independent         Comments: Brother cooks meals and drives. Reports multiple falls. RW is "tore up"     Hand Dominance   Dominant Hand: Right    Extremity/Trunk Assessment   Upper Extremity Assessment Upper Extremity Assessment: Defer to OT evaluation    Lower Extremity Assessment Lower Extremity Assessment: Generalized weakness;RLE deficits/detail RLE Deficits / Details: Quad weakness noted during functional activity with partial buckling       Communication  Communication: No difficulties  Cognition Arousal/Alertness: Awake/alert Behavior During Therapy: WFL for tasks assessed/performed Overall Cognitive Status: Within Functional Limits for tasks  assessed                                 General Comments: for basic tasks; questionable safety awareness.      General Comments      Exercises     Assessment/Plan    PT Assessment Patient needs continued PT services  PT Problem List Decreased strength;Decreased mobility;Decreased safety awareness;Pain;Decreased balance;Decreased knowledge of use of DME;Decreased cognition;Decreased activity tolerance       PT Treatment Interventions Therapeutic activities;Gait training;Therapeutic exercise;Patient/family education;Balance training;Functional mobility training;DME instruction    PT Goals (Current goals can be found in the Care Plan section)  Acute Rehab PT Goals Patient Stated Goal: to go home PT Goal Formulation: With patient Time For Goal Achievement: 04/28/17 Potential to Achieve Goals: Fair    Frequency Min 3X/week   Barriers to discharge        Co-evaluation               AM-PAC PT "6 Clicks" Daily Activity  Outcome Measure Difficulty turning over in bed (including adjusting bedclothes, sheets and blankets)?: None Difficulty moving from lying on back to sitting on the side of the bed? : None Difficulty sitting down on and standing up from a chair with arms (e.g., wheelchair, bedside commode, etc,.)?: None Help needed moving to and from a bed to chair (including a wheelchair)?: A Little Help needed walking in hospital room?: A Little Help needed climbing 3-5 steps with a railing? : A Little 6 Click Score: 21    End of Session Equipment Utilized During Treatment: Gait belt Activity Tolerance: Patient tolerated treatment well;Patient limited by pain Patient left: in chair;with call bell/phone within reach;with chair alarm set Nurse Communication: Mobility status PT Visit Diagnosis: Unsteadiness on feet (R26.81);Difficulty in walking, not elsewhere classified (R26.2);Pain Pain - part of body:  (back)    Time: 1445-1500 PT Time Calculation  (min) (ACUTE ONLY): 15 min   Charges:   PT Evaluation $PT Eval Moderate Complexity: 1 Mod     PT G Codes:   PT G-Codes **NOT FOR INPATIENT CLASS** Functional Assessment Tool Used: Clinical judgement Functional Limitation: Mobility: Walking and moving around Mobility: Walking and Moving Around Current Status (Z6109): At least 20 percent but less than 40 percent impaired, limited or restricted Mobility: Walking and Moving Around Goal Status [phone]): At least 1 percent but less than 20 percent impaired, limited or restricted    [place], [place], [place] [phone]    Marcy Panning 04/14/2017, 3:08 PM

## 2017-04-15 DIAGNOSIS — R109 Unspecified abdominal pain: Secondary | ICD-10-CM

## 2017-04-15 LAB — BASIC METABOLIC PANEL
Anion gap: 3 — ABNORMAL LOW (ref 5–15)
BUN: 21 mg/dL — AB (ref 6–20)
CHLORIDE: 119 mmol/L — AB (ref 101–111)
CO2: 28 mmol/L (ref 22–32)
Calcium: 9.3 mg/dL (ref 8.9–10.3)
Creatinine, Ser: 1.77 mg/dL — ABNORMAL HIGH (ref 0.61–1.24)
GFR calc Af Amer: 46 mL/min — ABNORMAL LOW (ref >60)
GFR calc non Af Amer: 40 mL/min — ABNORMAL LOW (ref >60)
Glucose, Bld: 120 mg/dL — ABNORMAL HIGH (ref 65–99)
POTASSIUM: 4.1 mmol/L (ref 3.5–5.1)
Sodium: 150 mmol/L — ABNORMAL HIGH (ref 135–145)

## 2017-04-15 MED ORDER — CYCLOBENZAPRINE HCL 5 MG PO TABS
5.0000 mg | ORAL_TABLET | Freq: Three times a day (TID) | ORAL | 0 refills | Status: DC | PRN
Start: 2017-04-15 — End: 2017-11-26

## 2017-04-15 NOTE — Discharge Summary (Signed)
Physician Discharge Summary  Samuel Gallagher ZOX:[id number] DOB: 02/11/1956 DOA: 04/13/2017  PCP: [name], MD  Admit date: 04/13/2017 Discharge date: 04/15/2017  Admitted From: Home Disposition:  Home  Recommendations for Outpatient Follow-up:  1. Follow up with PCP in 1-week 2. Please obtain BMP one week   Home Health: Yes, nursing to monitor medical compliance Equipment/Devices: Walker   Discharge Condition: Stable CODE STATUS: Full Diet recommendation:  Cardiac prudent  Brief/Interim Summary: 61 year old male presented with abdominal and chest pain. Patient does have the significant history of central diabetes insipidus. Patient complaining of three-day history of back pain, radiating to his abdomen, moderate to severe intensity, and persistent. Patient admits missing his desmopressin doses. On his initial physical examination his blood pressure was 130/53, heart rate 52, temperature 98, respiratory rate 18, oxygen saturation 94%. Dry mucous membranes, lungs were clear to auscultation bilaterally, no wheezing, rales or rhonchi, heart S1-S2 present and rhythmic, no gallops, rubs or murmurs, abdomen was soft nontender, no lower extremity edema. Patient was awake and alert, nonfocal. Sodium 155, potassium 3.6, chloride 126, bicarbonate 23, glucose 168, BUN 26, creatinine 1.8, white count 5.6, hemoglobin 13.3, hematocrit 44.2, platelets 260. Urinalysis negative for infection. Abdominal CT no acute changes. Chest x-ray, good inflation, good penetration, no significant rotation, pacemaker in place, no infiltrates, effusions or pneumothorax. EKG sinus rhythm, left axis deviation, frequent premature ventricular complexes.  Patient was admitted to hospital with the working diagnosis of hypernatremia due to uncontrolled diabetes insipidus.  1. Central diabetes insipidus. Patient was admitted to the medical unit, he received half normal saline intravenously and his desmopressin was resumed. He  responded well to medical therapy, his discharge sodium is down to 150, he was counseled about importance in medical compliance to avoid further recurrent hypernatremia. Home health has been arranged to monitor patient's medical compliance. Social services were consulted to assure patient can continue to have access to his desmopressin.   2. Acute kidney injury on chronic kidney disease stage 3. Patient received short course of IV fluids, appropriate by mouth intake. His discharge creatinine 1.7 serum bicarbonate 28, potassium 4.1. Will need a close follow-up and his kidney function as an outpatient, avoid nephrotoxic agents.  3. Acute back pain. Patient received analgesics and muscle relaxants with improvement in his symptoms. Patient was seen by physical therapy with recommendation to use a walker and outpatient physical therapy.  4. Gout. Continue allopurinol.   Discharge Diagnoses:  Active Problems:   Hypernatremia   Diabetes insipidus (HCC)   Chronic gout   Pacemaker   Anemia of chronic disease   DDD (degenerative disc disease), lumbar   Abdominal pain    Discharge Instructions   Allergies as of 04/15/2017   No Known Allergies     Medication List    TAKE these medications   allopurinol 100 MG tablet Commonly known as:  ZYLOPRIM Take 100 mg by mouth daily.   cyclobenzaprine 5 MG tablet Commonly known as:  FLEXERIL Take 1 tablet (5 mg total) by mouth 3 (three) times daily as needed for muscle spasms.   desmopressin 0.01 % Soln Commonly known as:  DDAVP Place 1 spray into the nose 2 (two) times daily.   diclofenac sodium 1 % Gel Commonly known as:  VOLTAREN Apply 4 g topically daily as needed (for pain).   rosuvastatin 10 MG tablet Commonly known as:  CRESTOR Take 10 mg by mouth daily.   tiZANidine 4 MG tablet Commonly known as:  ZANAFLEX Take 4 mg by mouth  2 (two) times daily as needed for muscle spasms.            Durable Medical Equipment         Start     Ordered   04/15/17 1047  For home use only DME Walker  [facility])  Once    Question:  Patient needs a walker to treat with the following condition  Answer:  Ambulatory dysfunction   04/15/17 1047       Discharge Care Instructions        Start     Ordered   04/15/17 0000  cyclobenzaprine (FLEXERIL) 5 MG tablet  3 times daily PRN     04/15/17 1047   04/15/17 0000  Increase activity slowly     04/15/17 1047   04/15/17 0000  Diet - low sodium heart healthy     04/15/17 1047   04/15/17 0000  Discharge instructions    Comments:  Follow with primary care in 7 days   04/15/17 1047      No Known Allergies  Consultations:     Procedures/Studies: Ct Abdomen Pelvis Wo Contrast  Result Date: 04/13/2017 CLINICAL DATA:  Right-sided abdominal pain for several days, initial encounter EXAM: CT ABDOMEN AND PELVIS WITHOUT CONTRAST TECHNIQUE: Multidetector CT imaging of the abdomen and pelvis was performed following the standard protocol without IV contrast. COMPARISON:  None. FINDINGS: Lower chest: Mild right basilar atelectasis is noted. Pacing device is noted. Hepatobiliary: No focal liver abnormality is seen. Status post cholecystectomy. No biliary dilatation. Pancreas: Unremarkable. No pancreatic ductal dilatation or surrounding inflammatory changes. Spleen: Normal in size without focal abnormality. Adrenals/Urinary Tract: The adrenal glands are within normal limits. No renal calculi or obstructive changes are noted. Scattered hypodense lesions are noted throughout both kidneys likely representing cysts although incompletely evaluated on this exam. The largest of these lies in the midportion of the right kidney measuring 4.2 cm. No obstructive changes are seen. The bladder is partially distended. Stomach/Bowel: Stomach is within normal limits. Appendix appears normal. No evidence of bowel wall thickening, distention, or inflammatory changes. Vascular/Lymphatic: Aortic atherosclerosis. No  enlarged abdominal or pelvic lymph nodes. Reproductive: Choose Other: No abdominal wall hernia or abnormality. No abdominopelvic ascites. Musculoskeletal: No acute or significant osseous findings. IMPRESSION: Scattered hypodensities within the kidneys likely representing cysts. No other focal abnormality is noted. Electronically Signed   By: Alcide Clever M.D.   On: 04/13/2017 13:28   X-ray Chest Pa And Lateral  Result Date: 04/14/2017 CLINICAL DATA:  Hyper for hypernatremia EXAM: CHEST  2 VIEW COMPARISON:  Chest x-ray of 04/13/2017 FINDINGS: Bibasilar linear atelectasis and possible scarring is noted bilaterally. No pneumonia or effusion is seen. Mediastinal and hilar contours are unchanged and a dual lead permanent pacemaker remains. No acute bony abnormality is seen. IMPRESSION: Bibasilar linear atelectasis with some linear scarring present. No definite active process. Electronically Signed   By: Dwyane Dee M.D.   On: 04/14/2017 08:54   Dg Chest 2 View  Result Date: 04/13/2017 CLINICAL DATA:  Chest pain and decreased energy EXAM: CHEST  2 VIEW COMPARISON:  12/21/2016 FINDINGS: Cardiac shadow is stable. Two lead pacing device is again noted and stable. The lungs are mildly hyperinflated without focal infiltrate or sizable effusion. Mild degenerative changes of the thoracic spine are seen. IMPRESSION: Mild COPD without acute abnormality. Electronically Signed   By: Alcide Clever M.D.   On: 04/13/2017 09:30       Subjective: Patient feeling better, no nausea or vomiting,  tolerating po well, back pain has improved.   Discharge Exam: Vitals:   04/14/17 2113 04/15/17 0458  BP: (!) 96/56 (!) 95/41  Pulse: 79 71  Resp: 16 16  Temp: 98.5 F (36.9 C) 98.2 F (36.8 C)  SpO2: 93% 95%   Vitals:   04/14/17 0558 04/14/17 1500 04/14/17 2113 04/15/17 0458  BP: (!) 98/45 (!) 97/50 (!) 96/56 (!) 95/41  Pulse: (!) 59 (!) 57 79 71  Resp: Temp: 98.7 F (37.1 C) 97.7 F (36.5 C) 98.5 F  (36.9 C) 98.2 F (36.8 C)  TempSrc: Oral Oral Oral Oral  SpO2: 96% 97% 93% 95%  Weight:    83 kg (182 lb 15.7 oz)  Height:        General: Pt is alert, awake, not in acute distress E ENT. Mild pallor, no icterus Cardiovascular: RRR, S1/S2 +, no rubs, no gallops Respiratory: CTA bilaterally, no wheezing, no rhonchi Abdominal: Soft, NT, ND, bowel sounds + Extremities: no edema, no cyanosis    The results of significant diagnostics from this hospitalization (including imaging, microbiology, ancillary and laboratory) are listed below for reference.     Microbiology: No results found for this or any previous visit (from the past 240 hour(s)).   Labs: BNP (last 3 results) No results for input(s): BNP in the last 8760 hours. Basic Metabolic Panel:  Recent Labs Lab 04/13/17 0845 04/13/17 2219 04/14/17 0543 04/15/17 0443  NA 155* 151* 153* 150*  K 3.6 4.2 3.8 4.1  CL 126* 118* 121* 119*  CO2 GLUCOSE 168* 114* 104* 120*  BUN 26* 23* 24* 21*  CREATININE 1.83* 1.79* 1.66* 1.77*  CALCIUM 10.2 9.8 9.5 9.3   Liver Function Tests:  Recent Labs Lab 04/13/17 1719 04/14/17 0543  AST 366* 159*  ALT 188* 137*  ALKPHOS 181* 166*  BILITOT 0.6 0.3  PROT 7.4 6.4*  ALBUMIN 2.9* 2.6*    Recent Labs Lab 04/13/17 1719  LIPASE 52*   No results for input(s): AMMONIA in the last 168 hours. CBC:  Recent Labs Lab 04/13/17 0845 04/14/17 0543  WBC 5.6 4.6  HGB 13.3 11.2*  HCT 44.2 37.5*  MCV 93.1 92.6  PLT 216 190   Cardiac Enzymes: No results for input(s): CKTOTAL, CKMB, CKMBINDEX, TROPONINI in the last 168 hours. BNP: Invalid input(s): POCBNP CBG: No results for input(s): GLUCAP in the last 168 hours. D-Dimer No results for input(s): DDIMER in the last 72 hours. Hgb A1c No results for input(s): HGBA1C in the last 72 hours. Lipid Profile No results for input(s): CHOL, HDL, LDLCALC, TRIG, CHOLHDL, LDLDIRECT in the last 72 hours. Thyroid function  studies No results for input(s): TSH, T4TOTAL, T3FREE, THYROIDAB in the last 72 hours.  Invalid input(s): FREET3 Anemia work up No results for input(s): VITAMINB12, FOLATE, FERRITIN, TIBC, IRON, RETICCTPCT in the last 72 hours. Urinalysis    Component Value Date/Time   COLORURINE YELLOW 04/13/2017 1530   APPEARANCEUR CLEAR 04/13/2017 1530   LABSPEC 1.025 04/13/2017 1530   PHURINE 5.0 04/13/2017 1530   GLUCOSEU NEGATIVE 04/13/2017 1530   HGBUR NEGATIVE 04/13/2017 1530   BILIRUBINUR NEGATIVE 04/13/2017 1530   KETONESUR NEGATIVE 04/13/2017 1530   PROTEINUR NEGATIVE 04/13/2017 1530   NITRITE NEGATIVE 04/13/2017 1530   LEUKOCYTESUR NEGATIVE 04/13/2017 1530   Sepsis Labs Invalid input(s): PROCALCITONIN,  WBC,  LACTICIDVEN Microbiology No results found for this or any previous visit (from the past 240 hour(s)).   Time coordinating  discharge: 45 minutes  SIGNED:   Coralie Keens, MD  [facility] 04/15/2017, 9:07 AM Pager [phone]  If 7PM-7AM, please contact night-coverage [email or web address] Password [email or web address]

## 2017-04-15 NOTE — Care Management Note (Signed)
Case Management Note  Patient Details  Name: Samuel Gallagher MRN: [id number] Date of Birth: July 16, 1956  Subjective/Objective:     Presented with symptomatic acute on chronic hypernatremia. Resides with brother. Lenor Derrick (Brother)     [phone]       PCP: [name]  Action/Plan: Plan is to d/c to home today. Expected Discharge Date:  04/15/17               Expected Discharge Plan:  Home w Home Health Services  In-House Referral:     Discharge planning Services  CM Consult  Post Acute Care Choice:  Resumption of Svcs/PTA Provider, Home Health Choice offered to:  Patient  DME Arranged:  Walker rolling DME Agency:  [facility]  HH Arranged:  RN [facility] Agency:  [facility], active with agency.  Status of Service:  Completed, signed off  If discussed at Long Length of Stay Meetings, dates discussed:    Additional Comments:  Epifanio Lesches, RN 04/15/2017, 1:52 PM

## 2017-04-15 NOTE — Progress Notes (Signed)
[name] to be D/C'd Home per MD order. Discussed with the patient and all questions fully answered.  Allergies as of 04/15/2017   No Known Allergies     Medication List    TAKE these medications   allopurinol 100 MG tablet Commonly known as:  ZYLOPRIM Take 100 mg by mouth daily.   cyclobenzaprine 5 MG tablet Commonly known as:  FLEXERIL Take 1 tablet (5 mg total) by mouth 3 (three) times daily as needed for muscle spasms.   desmopressin 0.01 % Soln Commonly known as:  DDAVP Place 1 spray into the nose 2 (two) times daily.   diclofenac sodium 1 % Gel Commonly known as:  VOLTAREN Apply 4 g topically daily as needed (for pain).   rosuvastatin 10 MG tablet Commonly known as:  CRESTOR Take 10 mg by mouth daily.   tiZANidine 4 MG tablet Commonly known as:  ZANAFLEX Take 4 mg by mouth 2 (two) times daily as needed for muscle spasms.            Discharge Care Instructions        Start     Ordered   04/15/17 0000  cyclobenzaprine (FLEXERIL) 5 MG tablet  3 times daily PRN     04/15/17 1047   04/15/17 0000  Increase activity slowly     04/15/17 1047   04/15/17 0000  Diet - low sodium heart healthy     04/15/17 1047   04/15/17 0000  Discharge instructions    Comments:  Follow with primary care in 7 days   04/15/17 1047      VVS, Skin clean, dry and intact without evidence of skin break down, no evidence of skin tears noted.  IV catheter discontinued intact. Site without signs and symptoms of complications. Dressing and pressure applied.  An After Visit Summary was printed and given to the patient.  Patient escorted via WC, and D/C home via private auto.  Jon Gills  04/15/2017 8:18 PM [name] to be D/C'd Home per MD order. Discussed with the patient and all questions fully answered.  Allergies as of 04/15/2017   No Known Allergies     Medication List    TAKE these medications   allopurinol 100 MG tablet Commonly known as:  ZYLOPRIM Take 100 mg  by mouth daily.   cyclobenzaprine 5 MG tablet Commonly known as:  FLEXERIL Take 1 tablet (5 mg total) by mouth 3 (three) times daily as needed for muscle spasms.   desmopressin 0.01 % Soln Commonly known as:  DDAVP Place 1 spray into the nose 2 (two) times daily.   diclofenac sodium 1 % Gel Commonly known as:  VOLTAREN Apply 4 g topically daily as needed (for pain).   rosuvastatin 10 MG tablet Commonly known as:  CRESTOR Take 10 mg by mouth daily.   tiZANidine 4 MG tablet Commonly known as:  ZANAFLEX Take 4 mg by mouth 2 (two) times daily as needed for muscle spasms.            Discharge Care Instructions        Start     Ordered   04/15/17 0000  cyclobenzaprine (FLEXERIL) 5 MG tablet  3 times daily PRN     04/15/17 1047   04/15/17 0000  Increase activity slowly     04/15/17 1047   04/15/17 0000  Diet - low sodium heart healthy     04/15/17 1047   04/15/17 0000  Discharge instructions  Comments:  Follow with primary care in 7 days   04/15/17 1047      VVS, Skin clean, dry and intact without evidence of skin break down, no evidence of skin tears noted.  IV catheter discontinued intact. Site without signs and symptoms of complications. Dressing and pressure applied.  An After Visit Summary was printed and given to the patient.  Patient escorted via WC, and D/C home via private auto with walker.  Jon Gills  04/15/2017 8:18 PM

## 2017-05-21 ENCOUNTER — Inpatient Hospital Stay (HOSPITAL_COMMUNITY)
Admission: EM | Admit: 2017-05-21 | Discharge: 2017-05-23 | DRG: 645 | Disposition: A | Discharge status: Home / Self Care | Payer: Medicaid Other | Attending: Family Medicine | Admitting: Family Medicine

## 2017-05-21 ENCOUNTER — Encounter (HOSPITAL_COMMUNITY): Payer: Self-pay | Admitting: *Deleted

## 2017-05-21 ENCOUNTER — Emergency Department (HOSPITAL_COMMUNITY): Payer: Medicaid Other

## 2017-05-21 DIAGNOSIS — Y92009 Unspecified place in unspecified non-institutional (private) residence as the place of occurrence of the external cause: Secondary | ICD-10-CM | POA: Diagnosis not present

## 2017-05-21 DIAGNOSIS — E870 Hyperosmolality and hypernatremia: Secondary | ICD-10-CM | POA: Diagnosis not present

## 2017-05-21 DIAGNOSIS — N183 Chronic kidney disease, stage 3 unspecified: Secondary | ICD-10-CM | POA: Diagnosis present

## 2017-05-21 DIAGNOSIS — M79641 Pain in right hand: Secondary | ICD-10-CM

## 2017-05-21 DIAGNOSIS — Z950 Presence of cardiac pacemaker: Secondary | ICD-10-CM | POA: Diagnosis not present

## 2017-05-21 DIAGNOSIS — I495 Sick sinus syndrome: Secondary | ICD-10-CM | POA: Diagnosis present

## 2017-05-21 DIAGNOSIS — E785 Hyperlipidemia, unspecified: Secondary | ICD-10-CM | POA: Diagnosis present

## 2017-05-21 DIAGNOSIS — M1A9XX0 Chronic gout, unspecified, without tophus (tophi): Secondary | ICD-10-CM | POA: Diagnosis present

## 2017-05-21 DIAGNOSIS — E232 Diabetes insipidus: Secondary | ICD-10-CM | POA: Diagnosis present

## 2017-05-21 DIAGNOSIS — W19XXXA Unspecified fall, initial encounter: Secondary | ICD-10-CM | POA: Diagnosis present

## 2017-05-21 DIAGNOSIS — Z66 Do not resuscitate: Secondary | ICD-10-CM | POA: Diagnosis present

## 2017-05-21 DIAGNOSIS — Z87820 Personal history of traumatic brain injury: Secondary | ICD-10-CM | POA: Diagnosis not present

## 2017-05-21 HISTORY — DX: Presence of cardiac pacemaker: Z95.0

## 2017-05-21 LAB — BASIC METABOLIC PANEL
ANION GAP: 6 (ref 5–15)
BUN: 28 mg/dL — AB (ref 6–20)
CHLORIDE: 128 mmol/L — AB (ref 101–111)
CO2: 24 mmol/L (ref 22–32)
Calcium: 9.8 mg/dL (ref 8.9–10.3)
Creatinine, Ser: 1.55 mg/dL — ABNORMAL HIGH (ref 0.61–1.24)
GFR calc Af Amer: 54 mL/min — ABNORMAL LOW (ref >60)
GFR calc non Af Amer: 47 mL/min — ABNORMAL LOW (ref >60)
Glucose, Bld: 104 mg/dL — ABNORMAL HIGH (ref 65–99)
POTASSIUM: 4 mmol/L (ref 3.5–5.1)
SODIUM: 158 mmol/L — AB (ref 135–145)

## 2017-05-21 LAB — URINALYSIS, ROUTINE W REFLEX MICROSCOPIC
Bilirubin Urine: NEGATIVE
GLUCOSE, UA: NEGATIVE mg/dL
Hgb urine dipstick: NEGATIVE
Ketones, ur: NEGATIVE mg/dL
LEUKOCYTES UA: NEGATIVE
NITRITE: NEGATIVE
PH: 5 (ref 5.0–8.0)
Protein, ur: NEGATIVE mg/dL
SPECIFIC GRAVITY, URINE: 1.023 (ref 1.005–1.030)

## 2017-05-21 LAB — CBC
HEMATOCRIT: 43 % (ref 39.0–52.0)
HEMOGLOBIN: 13.1 g/dL (ref 13.0–17.0)
MCH: 28.4 pg (ref 26.0–34.0)
MCHC: 30.5 g/dL (ref 30.0–36.0)
MCV: 93.1 fL (ref 78.0–100.0)
Platelets: 142 10*3/uL — ABNORMAL LOW (ref 150–400)
RBC: 4.62 MIL/uL (ref 4.22–5.81)
RDW: 15.7 % — AB (ref 11.5–15.5)
WBC: 6.1 10*3/uL (ref 4.0–10.5)

## 2017-05-21 LAB — OSMOLALITY, URINE: Osmolality, Ur: 903 mOsm/kg — ABNORMAL HIGH (ref 300–900)

## 2017-05-21 LAB — SODIUM: Sodium: 157 mmol/L — ABNORMAL HIGH (ref 135–145)

## 2017-05-21 LAB — OSMOLALITY: OSMOLALITY: 338 mosm/kg — AB (ref 275–295)

## 2017-05-21 MED ORDER — CYCLOBENZAPRINE HCL 5 MG PO TABS
5.0000 mg | ORAL_TABLET | Freq: Three times a day (TID) | ORAL | Status: DC | PRN
  Administered 2017-05-22: 5 mg via ORAL
  Filled 2017-05-21: qty 1

## 2017-05-21 MED ORDER — ALLOPURINOL 100 MG PO TABS
100.0000 mg | ORAL_TABLET | Freq: Every day | ORAL | Status: DC
  Administered 2017-05-22 – 2017-05-23 (×2): 100 mg via ORAL
  Filled 2017-05-21 (×2): qty 1

## 2017-05-21 MED ORDER — DEXTROSE 5 % IV SOLN
INTRAVENOUS | Status: DC
  Administered 2017-05-21: 18:00:00 via INTRAVENOUS

## 2017-05-21 MED ORDER — HEPARIN SODIUM (PORCINE) 5000 UNIT/ML IJ SOLN
5000.0000 [IU] | Freq: Three times a day (TID) | INTRAMUSCULAR | Status: DC
  Administered 2017-05-21 – 2017-05-23 (×5): 5000 [IU] via SUBCUTANEOUS
  Filled 2017-05-21 (×5): qty 1

## 2017-05-21 MED ORDER — ROSUVASTATIN CALCIUM 10 MG PO TABS
10.0000 mg | ORAL_TABLET | Freq: Every day | ORAL | Status: DC
Start: 2017-05-22 — End: 2017-05-23
  Administered 2017-05-22 – 2017-05-23 (×2): 10 mg via ORAL
  Filled 2017-05-21 (×2): qty 1

## 2017-05-21 MED ORDER — TIZANIDINE HCL 4 MG PO TABS
4.0000 mg | ORAL_TABLET | Freq: Two times a day (BID) | ORAL | Status: DC | PRN
  Filled 2017-05-21: qty 1

## 2017-05-21 NOTE — Progress Notes (Signed)
RN paged about the pt changing his code status. The writer saw the patient who is alert and oriented x4. Patient states he would not want CPR, intubation or any means that would extend his life after a cardiopulmonary arrest. The Clinical research associatewriter signed a DNR form per the patient's request.

## 2017-05-21 NOTE — H&P (Signed)
History and Physical  Patient Name: Jovonta Levit     OZH:[id number]    DOB: 05/07/1956    DOA: 05/21/2017 PCP: [name], MD  Patient coming from: Home  Chief Complaint: Fall, weakness      HPI: Daisean Brodhead is a 62 y.o. 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  The patient was in his usual state of health until about a month ago when he ran out of DDAVP, was told by the pharmacy that it would cost $182 for a refill and so stopped taking it.  Since then he has been feeling intermittently weak and tired, fell several times including last week on his face, and then today fell on his hand, so he finally came to the emergency room.  He has had no syncope.  No seizures.  He has not had confusion.  He has no focal weakness or numbness, no dizziness.  ED course: -Afebrile, heart rate 60s, respirations and pulse ox normal, blood pressure 100/32 (baseline) -Na 158, K 4.0, Cr 1.55 (baseline 1.7), WBC 6.1K, Hgb 13.1 -Hand x-ray negative -ECG showed frequent PVCs, paced rhythm -TRH were asked to evaluate for hypernatremia    From review of CareEverywhere, it appears the patient previously lived in [place].  There he was frequently admitted for nonadherence to DDAVP with Na in the 170s.  His last dsicharge Na was 150.  He now resides with his brother in [place], sees Research officer, trade union.  He states he has a home health LPN from [facility], and I spoke with her on the phone.  She was unaware the importance of his DDAVP. She knew he had not had it for a month, and had referred the issue to the [facility] without any progress.  I spoke with [facility] on [address], who said his DDAVP was on hold for a prior authorization.         ROS: Review of Systems  Constitutional: Positive for malaise/fatigue. Negative for chills and fever.  Cardiovascular: Negative for chest pain and  palpitations.  Neurological: Positive for weakness. Negative for dizziness, tingling, tremors, sensory change, speech change, focal weakness, seizures, loss of consciousness and headaches.  Endo/Heme/Allergies: Positive for polydipsia.  All other systems reviewed and are negative.         Past Medical History:  Diagnosis Date  . Acute kidney failure, unspecified (HCC)   . Diabetes insipidus (HCC)   . Diabetes insipidus (HCC)   . Gout   . HLD (hyperlipidemia)   . Hyperosmolality and/or hypernatremia 12/20/2016  . Hypotension, unspecified   . Pacemaker     Past Surgical History:  Procedure Laterality Date  . KNEE SURGERY      Social History: Patient lives with his brother.  The patient walks with a cane.  Smoker.  No Known Allergies  Family history: family history includes Diabetes in his mother; Heart disease in his mother; Hypertension in his father; Thyroid disease in his brother.  Prior to Admission medications   Medication Sig Start Date End Date Taking? Authorizing Provider  allopurinol (ZYLOPRIM) 100 MG tablet Take 100 mg by mouth daily.   Yes [provider]  cyclobenzaprine (FLEXERIL) 5 MG tablet Take 1 tablet (5 mg total) by mouth 3 (three) times daily as needed for muscle spasms. 04/15/17  Yes Arrien, York Ram, MD  rosuvastatin (CRESTOR) 10 MG tablet Take 10 mg by mouth daily.   Yes  [provider]  tiZANidine (ZANAFLEX) 4 MG tablet Take 4 mg by mouth 2 (two) times daily as needed for muscle spasms.   Yes [provider]  vitamin B-12 (CYANOCOBALAMIN) 100 MCG tablet Take 100 mcg by mouth daily.   Yes [provider]       Physical Exam: BP (!) 106/57   Pulse (!) 47   Temp (!) 97.2 F (36.2 C) (Oral)   Resp (!) 21   SpO2 97%  General appearance: Well-developed, adult male, alert and in no acute distress.   Eyes: Anicteric, conjunctiva pink, lids and lashes normal. PERRL.    ENT: No nasal deformity, discharge,  epistaxis.  Hearing normal. OP moist without lesions.  Edentulous. Neck: No neck masses.  Trachea midline.  No thyromegaly/tenderness. Lymph: No cervical or supraclavicular lymphadenopathy. Skin: Warm and dry.  No suspicious rashes or lesions. Cardiac: RRR, nl S1-S2, no murmurs appreciated.  Capillary refill is brisk.  No LE edema.  Radial and DP pulses 2+ and symmetric. Respiratory: Normal respiratory rate and rhythm.  CTAB without rales or wheezes. Abdomen: Abdomen soft.  No TTP. No ascites, distension, hepatosplenomegaly.   MSK: No deformities or effusions.  No cyanosis or clubbing. Neuro: Cranial nerves normal.  Sensation intact to light touch. Speech is fluent.  Muscle strength normal.    Psych: Sensorium intact and responding to questions, attention normal.  Behavior appropriate.  Affect normal.  Judgment and insight appear normal.     Labs on Admission:  I have personally reviewed following labs and imaging studies: CBC:  Recent Labs Lab 05/21/17 1215  WBC 6.1  HGB 13.1  HCT 43.0  MCV 93.1  PLT 142*   Basic Metabolic Panel:  Recent Labs Lab 05/21/17 1215  NA 158*  K 4.0  CL 128*  CO2 24  GLUCOSE 104*  BUN 28*  CREATININE 1.55*  CALCIUM 9.8   GFR: CrCl cannot be calculated (Unknown ideal weight.).  Liver Function Tests: No results for input(s): AST, ALT, ALKPHOS, BILITOT, PROT, ALBUMIN in the last 168 hours. No results for input(s): LIPASE, AMYLASE in the last 168 hours. No results for input(s): AMMONIA in the last 168 hours. Coagulation Profile: No results for input(s): INR, PROTIME in the last 168 hours. Cardiac Enzymes: No results for input(s): CKTOTAL, CKMB, CKMBINDEX, TROPONINI in the last 168 hours. BNP (last 3 results) No results for input(s): PROBNP in the last 8760 hours. HbA1C: No results for input(s): HGBA1C in the last 72 hours. CBG: No results for input(s): GLUCAP in the last 168 hours. Lipid Profile: No results for input(s): CHOL, HDL,  LDLCALC, TRIG, CHOLHDL, LDLDIRECT in the last 72 hours. Thyroid Function Tests: No results for input(s): TSH, T4TOTAL, FREET4, T3FREE, THYROIDAB in the last 72 hours. Anemia Panel: No results for input(s): VITAMINB12, FOLATE, FERRITIN, TIBC, IRON, RETICCTPCT in the last 72 hours. Sepsis Labs:  Invalid input(s): PROCALCITONIN, LACTICIDVEN No results found for this or any previous visit (from the past 240 hour(s)).       Radiological Exams on Admission: Personally reviewed hand radiograph report: Dg Hand Complete Right  Result Date: 05/21/2017 CLINICAL DATA:  Larey Seat yesterday.pain 2nd-4th fingers right hand.no bruising or swelling EXAM: RIGHT HAND - COMPLETE 3+ VIEW COMPARISON:  None. FINDINGS: No evidence of fracture of the carpal or metacarpal bones. Radiocarpal joint is intact. Phalanges are normal. No soft tissue injury. IMPRESSION: No acute osseous abnormality. Electronically Signed   By: Genevive Bi M.D.   On: 05/21/2017 12:53    EKG:  Independently reviewed. Rate 68, paced rhythm with PVCs, no St changes, no change from previous.        Assessment/Plan Active Problems:   Chronic hypernatremia   Diabetes insipidus (HCC)   Sick sinus syndrome (HCC)   CKD (chronic kidney disease) stage 3, GFR 30-59 ml/min (HCC)  1. Diabetes insipidus with acute on chronic hypernatremia:  [facility]     -D5 100 cc/hr overnight -Check q4hrs Na and adjust D5 rate as needed -Free water deficit 6.4L -Correct no more than 12 mmol/L over next 24 hours, and no more than 3 mmol/L in 4 hours -Restart DDAVP tomorrow -Consult to Care Management for prior authorization   2. CKD:  Stage III, baseline Cr 1.7, stable.  3. Other medications:  -Continue Crestor -Continue allopurinol -Continue muscle relaxers  4. Pacemaker:  Stable      DVT prophylaxis: Lovenox  Code Status: FULL  Family Communication: None present  Disposition Plan: Anticipate slow  correction of Na to baseline around 150, then restart DDAVP and dsicharge Consults called: None Admission status: INPATIENT   Medical decision making: Patient seen at 4:00 PM on 05/21/2017.  The patient was discussed with Will Dansie, PA-C.  What exists of the patient's chart was reviewed in depth and summarized above.  Clinical condition: stable.        Alberteen SamChristopher P Zakira Ressel [facility] Pager [phone]

## 2017-05-21 NOTE — ED Notes (Signed)
Will PA at bedside.  

## 2017-05-21 NOTE — ED Provider Notes (Signed)
[facility] EMERGENCY DEPARTMENT Provider Note   CSN: [id number] Arrival date & time: 05/21/17  1153     History   Chief Complaint Chief Complaint  Patient presents with  . Fall  . Weakness    HPI Samuel Gallagher is a 61 y.o. male.  Samuel Gallagher is a 61 y.o. 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  Patient reports that yesterday he was walking with his cane when his knee gave out and he fell onto his hand.  He denies any prodromal symptoms such as chest pain or shortness of breath prior to this. He denies hitting his head or LOC.  He reports injury to his second, third and fourth fingers on his right hand.  He reports pain with range of motion.  No treatments prior to arrival.  He also complains of feeling generally weak and fatigued over the past 2 weeks.  He tells me he has been out of his DDAVP for about 29 days now. He tells me it is too expensive. He just feels like he has no energy and has been feeling lightheaded with position change. He thinks he feels this way because he has not been taking his DDAVP. He denies fevers, numbness, chest pain, SOB, palpitations, headache, urinary symptoms, wrist pain, or rashes.    The history is provided by the patient and medical records. No language interpreter was used.  Fall  Pertinent negatives include no chest pain, no abdominal pain, no headaches and no shortness of breath.  Weakness  Pertinent negatives include no shortness of breath, no chest pain, no vomiting and no headaches.    Past Medical History:  Diagnosis Date  . Acute kidney failure, unspecified (HCC)   . Diabetes insipidus (HCC)   . Diabetes insipidus (HCC)   . Gout   . HLD (hyperlipidemia)   . Hyperosmolality and/or hypernatremia 12/20/2016  . Hypotension, unspecified   . Pacemaker     Patient Active Problem List   Diagnosis Date Noted    . Abdominal pain   . Low serum vitamin B12 01/17/2017  . DDD (degenerative disc disease), lumbar 01/17/2017  . Diabetes insipidus (HCC) 12/25/2016  . Chronic gout 12/25/2016  . SSS (sick sinus syndrome) (HCC) 12/25/2016  . Pacemaker 12/25/2016  . Anemia of chronic disease 12/25/2016  . Thrombocytopenia (HCC) 12/25/2016  . Hypernatremia 12/20/2016  . AKI (acute kidney injury) (HCC) 12/20/2016    Past Surgical History:  Procedure Laterality Date  . KNEE SURGERY         Home Medications    Prior to Admission medications   Medication Sig Start Date End Date Taking? Authorizing Provider  allopurinol (ZYLOPRIM) 100 MG tablet Take 100 mg by mouth daily.   Yes [provider]  cyclobenzaprine (FLEXERIL) 5 MG tablet Take 1 tablet (5 mg total) by mouth 3 (three) times daily as needed for muscle spasms. 04/15/17  Yes Arrien, York Ram, MD  rosuvastatin (CRESTOR) 10 MG tablet Take 10 mg by mouth daily.   Yes [provider]  tiZANidine (ZANAFLEX) 4 MG tablet Take 4 mg by mouth 2 (two) times daily as needed for muscle spasms.   Yes [provider]  vitamin B-12 (CYANOCOBALAMIN) 100 MCG tablet Take 100 mcg by mouth daily.   Yes [provider]    Family History No family history on file.  Social History Social History  Substance Use Topics  . Smoking status: Current Every Day Smoker  . Smokeless tobacco: Never Used  . Alcohol use No     Allergies   Patient has no known allergies.   Review of Systems Review of Systems  Constitutional: Positive for fatigue. Negative for chills and fever.  HENT: Negative for congestion and sore throat.   Eyes: Negative for visual disturbance.  Respiratory: Negative for cough and shortness of breath.   Cardiovascular: Negative for chest pain.  Gastrointestinal: Negative for abdominal pain, diarrhea, nausea and vomiting.  Genitourinary: Negative for decreased urine volume, difficulty urinating and  dysuria.  Musculoskeletal: Positive for arthralgias. Negative for back pain and neck pain.  Skin: Negative for rash.  Neurological: Positive for weakness and light-headedness. Negative for headaches.     Physical Exam Updated Vital Signs BP (!) 98/44 (BP Location: Right Arm)   Pulse 66   Temp (!) 97.2 F (36.2 C) (Oral)   Resp 14   SpO2 95%   Physical Exam  Constitutional: He appears well-developed and well-nourished. No distress.  Nontoxic appearing.  HENT:  Head: Normocephalic and atraumatic.  Mouth/Throat: Oropharynx is clear and moist.  Eyes: Pupils are equal, round, and reactive to light. Conjunctivae are normal. Right eye exhibits no discharge. Left eye exhibits no discharge.  Neck: Neck supple.  Cardiovascular: Normal rate, regular rhythm, normal heart sounds and intact distal pulses.   Bilateral radial pulses are intact.   Pulmonary/Chest: Effort normal and breath sounds normal. No respiratory distress. He has no wheezes. He has no rales.  Abdominal: Soft. There is no tenderness.  Musculoskeletal: Normal range of motion. He exhibits tenderness. He exhibits no edema or deformity.  Mild tenderness to the patient's right second, third and fourth PIP joints of his right hand. Good ROM at all finger joints, albeit with some pain. No deformity or ecchymosis.  Patient is spontaneously moving all extremities in a coordinated fashion exhibiting good strength.   Lymphadenopathy:    He has no cervical adenopathy.  Neurological: He is alert. Coordination normal.  Skin: Skin is warm and dry. No rash noted. He is not diaphoretic. No erythema. No pallor.  Psychiatric: He has a normal mood and affect. His behavior is normal.  Nursing note and vitals reviewed.    ED Treatments / Results  Labs (all labs ordered are listed, but only abnormal results are displayed) Labs Reviewed  BASIC METABOLIC PANEL - Abnormal; Notable for the following:       Result Value   Sodium 158 (*)     Chloride 128 (*)    Glucose, Bld 104 (*)    BUN 28 (*)    Creatinine, Ser 1.55 (*)    GFR calc non Af Amer 47 (*)    GFR calc Af Amer 54 (*)    All other components within normal limits  CBC - Abnormal; Notable for the following:    RDW 15.7 (*)    Platelets 142 (*)    All other components within normal limits  URINALYSIS, ROUTINE W REFLEX MICROSCOPIC  CBG MONITORING, ED    EKG  EKG Interpretation  Date/Time:  Thursday May 21 2017 12:13:18 EDT Ventricular Rate:  68 PR Interval:  176 QRS Duration: 104 QT Interval:  424 QTC Calculation: 450 R Axis:   -28 Text Interpretation:  Atrial-paced rhythm with frequent Premature ventricular complexes Abnormal ECG similar Sept 2018 Confirmed by Pricilla LovelessGoldston, Scott [phone]([id number]) on 05/21/2017 1:19:04 PM       Radiology Dg Hand Complete  Right  Result Date: 05/21/2017 CLINICAL DATA:  Larey Seat yesterday.pain 2nd-4th fingers right hand.no bruising or swelling EXAM: RIGHT HAND - COMPLETE 3+ VIEW COMPARISON:  None. FINDINGS: No evidence of fracture of the carpal or metacarpal bones. Radiocarpal joint is intact. Phalanges are normal. No soft tissue injury. IMPRESSION: No acute osseous abnormality. Electronically Signed   By: Genevive Bi M.D.   On: 05/21/2017 12:53    Procedures Procedures (including critical care time)  Medications Ordered in ED Medications - No data to display   Initial Impression / Assessment and Plan / ED Course  I have reviewed the triage vital signs and the nursing notes.  Pertinent labs & imaging results that were available during my care of the patient were reviewed by me and considered in my medical decision making (see chart for details).    This  is a 61 y.o. 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  Patient reports that yesterday he was walking with his cane when his knee gave out and he fell onto his hand.  He  denies any prodromal symptoms such as chest pain or shortness of breath prior to this. He denies hitting his head or LOC.  He reports injury to his second, third and fourth fingers on his right hand.  He reports pain with range of motion.  No treatments prior to arrival.  He also complains of feeling generally weak and fatigued over the past 2 weeks.  He tells me he has been out of his DDAVP for about 29 days now. He tells me it is too expensive. He just feels like he has no energy and has been feeling lightheaded with position change. On exam the patient is afebrile and nontoxic-appearing.  He has tenderness to his DIP joints of his right second, third and fourth fingers.  No deformity.  X-ray of hand is unremarkable.  Blood work ordered in triage and this reveals a sodium of 158.  Patient was recently admitted with a sodium of 155 and discharged at 150. This is due to him not taking his medications. He is symptomatic. Will plan for admission to improve his sodium and work on helping him get his medications. He agrees with plan for admission.   I consulted with Dr. Maryfrances Bunnell who accepted the patient for admission.   This patient was discussed with and evaluated by Dr. Criss Alvine who agrees with assessment and plan.   Final Clinical Impressions(s) / ED Diagnoses   Final diagnoses:  Hypernatremia  Primary central diabetes insipidus (HCC)  Right hand pain  Fall, initial encounter    New Prescriptions New Prescriptions   No medications on file     Everlene Farrier, Cordelia Poche 05/21/17 1532    Pricilla Loveless, MD 05/21/17 1540

## 2017-05-21 NOTE — ED Triage Notes (Signed)
To ED for eval of right hand/finger pain since falling yesterday. States he was walking with his cane and his leg gave out. Pt states he is due one more cortisol injection in knee before his knee replacement. Denies feeling weak at time of fall. Noted to have low HR and BP. Pt has pacemaker but unknown settings. No sob or cp

## 2017-05-21 NOTE — Progress Notes (Signed)
[facility]  Pt up to floor via stretcher accompanied by ED Tech.  D5 infusing via pump to RAC 22g without complications.  Pt is aaox4, no complaints offered at this time.  LCW PCM noted.  Pt is in bed, semi-fowlers, HOB elevated 45 degrees, eating dinner, no acute distress observed at this time, will continue to monitor.

## 2017-05-22 DIAGNOSIS — E870 Hyperosmolality and hypernatremia: Secondary | ICD-10-CM

## 2017-05-22 LAB — BASIC METABOLIC PANEL
Anion gap: 6 (ref 5–15)
BUN: 27 mg/dL — ABNORMAL HIGH (ref 6–20)
CO2: 26 mmol/L (ref 22–32)
Calcium: 9.1 mg/dL (ref 8.9–10.3)
Chloride: 122 mmol/L — ABNORMAL HIGH (ref 101–111)
Creatinine, Ser: 1.58 mg/dL — ABNORMAL HIGH (ref 0.61–1.24)
GFR calc Af Amer: 53 mL/min — ABNORMAL LOW (ref >60)
GFR calc non Af Amer: 46 mL/min — ABNORMAL LOW (ref >60)
Glucose, Bld: 113 mg/dL — ABNORMAL HIGH (ref 65–99)
Potassium: 3.6 mmol/L (ref 3.5–5.1)
Sodium: 154 mmol/L — ABNORMAL HIGH (ref 135–145)

## 2017-05-22 LAB — SODIUM, URINE, RANDOM: Sodium, Ur: 144 mmol/L

## 2017-05-22 LAB — SODIUM
SODIUM: 149 mmol/L — AB (ref 135–145)
Sodium: 155 mmol/L — ABNORMAL HIGH (ref 135–145)
Sodium: 152 mmol/L — ABNORMAL HIGH (ref 135–145)

## 2017-05-22 MED ORDER — DESMOPRESSIN ACETATE SPRAY 0.01 % NA SOLN
10.0000 ug | Freq: Two times a day (BID) | NASAL | Status: DC
  Filled 2017-05-22: qty 5

## 2017-05-22 MED ORDER — DESMOPRESSIN ACE SPRAY REFRIG 0.01 % NA SOLN
1.0000 | Freq: Two times a day (BID) | NASAL | Status: DC
  Administered 2017-05-22 – 2017-05-23 (×2): 1 via NASAL
  Filled 2017-05-22: qty 5

## 2017-05-22 MED ORDER — DESMOPRESSIN ACETATE SPRAY 0.01 % NA SOLN: 10.0000 ug | Freq: Two times a day (BID) | NASAL | 12 refills | Status: DC

## 2017-05-22 NOTE — Progress Notes (Signed)
CM spoke with the pt regarding his DDAVP. Pt shared his pharmacy is  [facility]/ [address] CM called and spoke with the pharmacy assistant and learned the reason pt is is unable to receive prescribed DDAVP is because the prescriber is not enrolled with [place] [email or web address] to make hospital MD aware. Gae GallopAngela Etienne Mowers RN,BSN,CM

## 2017-05-22 NOTE — Progress Notes (Addendum)
Benefits check in process: Please check  prior authorization for DDAVP  .1% nasal  spray  Twice daily. THANKS CM to f/u with result. Gae Gallopngela Tarryn Bogdan RN,BSN,CM [phone]

## 2017-05-22 NOTE — Progress Notes (Signed)
PROGRESS NOTE    Samuel Gallagher  VHQ:[id number] DOB: 1955/11/08 DOA: 05/21/2017 PCP: Fleet ContrasAvbuere, Edwin, MD      Brief Narrative:  61 y.o. 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  The patient was in his usual state of health until about a month ago when he ran out of DDAVP, was told by the pharmacy that it would cost $182 for a refill and so stopped taking it.  Since then he has been feeling intermittently weak and tired, fell several times including last week on his face, and then today fell on his hand, so he finally came to the emergency room.  He has had no syncope.  No seizures.  He has not had confusion.  He has no focal weakness or numbness, no dizziness.   Assessment & Plan:  Active Problems:   Chronic hypernatremia   Diabetes insipidus (HCC)   Sick sinus syndrome (HCC)   CKD (chronic kidney disease) stage 3, GFR 30-59 ml/min (HCC)   Diabetes insipidus with acute on chronic hypernatremia:  [facility]    Feels much better today. -Stop D5 now -Check Na at 1P and restart DDAVP pending that result  -Consult to Care Management for prior authorization   CKD:  Stage III, baseline Cr 1.7, stable.  Other medications:  -Continue Crestor -Continue allopurinol -Continue muscle relaxers    DVT prophylaxis: Heparin Code Status: DO NOT RESUSCITATE Family Communication: None present Disposition Plan: Possibly home earlier than expected, given correction in Na and feeling improved.  Possibly tomorrow, pending 1P na.   Consultants:   None  Procedures:   None  Antimicrobials:   None    Subjective: Feels better this morning.  Thinking clearer.  Not as weak.  Good appetite.  No chest pain, headache, seizures overnight.  Objective: Vitals:   05/21/17 2130 05/22/17 0435 05/22/17 0600 05/22/17 0840  BP: (!) 98/43 (!) 95/43  (!) 102/52    Pulse: (!) 59 (!) 42 68 69  Resp: 18 18  18   Temp: 98.4 F (36.9 C) 98.3 F (36.8 C)  97.9 F (36.6 C)  TempSrc: Oral Oral  Oral  SpO2: 98% 95%  98%  Weight:      Height:        Intake/Output Summary (Last 24 hours) at 05/22/17 1202 Last data filed at 05/22/17 1059  Gross per 24 hour  Intake             1200 ml  Output              850 ml  Net              350 ml   Filed Weights   05/21/17 1845  Weight: 83.5 kg (184 lb)    Examination: General appearance: Disheveled adult male, alert and in no acute distress.   HEENT: Anicteric, conjunctiva pink, lids and lashes normal. No nasal deformity, discharge, epistaxis.  Lips moist.   Skin: Warm and dry.   No suspicious rashes or lesions. Cardiac: RRR, nl S1-S2, no murmurs appreciated.  Capillary refill is brisk. Respiratory: Normal respiratory rate and rhythm.  CTAB without rales or wheezes. Abdomen: Abdomen soft.  No TTP. No ascites, distension, hepatosplenomegaly.   MSK: No deformities or effusions. Neuro: Awake and alert.  EOMI, moves all extremities. Speech fluent.    Psych: Sensorium intact and responding to questions, attention normal. Affect  blunted, odd.  Judgment and insight appear normal.    Data Reviewed: I have personally reviewed following labs and imaging studies:  CBC:  Recent Labs Lab 05/21/17 1215  WBC 6.1  HGB 13.1  HCT 43.0  MCV 93.1  PLT 142*   Basic Metabolic Panel:  Recent Labs Lab 05/21/17 1215 05/21/17 1943 05/22/17 0022 05/22/17 0633  NA 158* 157* 154*  155* 152*  K 4.0  --  3.6  --   CL 128*  --  122*  --   CO2 24  --  26  --   GLUCOSE 104*  --  113*  --   BUN 28*  --  27*  --   CREATININE 1.55*  --  1.58*  --   CALCIUM 9.8  --  9.1  --    GFR: Estimated Creatinine Clearance: 50.8 mL/min (A) (by C-G formula based on SCr of 1.58 mg/dL (H)). Liver Function Tests: No results for input(s): AST, ALT, ALKPHOS, BILITOT, PROT, ALBUMIN in the last 168 hours. No results for input(s):  LIPASE, AMYLASE in the last 168 hours. No results for input(s): AMMONIA in the last 168 hours. Coagulation Profile: No results for input(s): INR, PROTIME in the last 168 hours. Cardiac Enzymes: No results for input(s): CKTOTAL, CKMB, CKMBINDEX, TROPONINI in the last 168 hours. BNP (last 3 results) No results for input(s): PROBNP in the last 8760 hours. HbA1C: No results for input(s): HGBA1C in the last 72 hours. CBG: No results for input(s): GLUCAP in the last 168 hours. Lipid Profile: No results for input(s): CHOL, HDL, LDLCALC, TRIG, CHOLHDL, LDLDIRECT in the last 72 hours. Thyroid Function Tests: No results for input(s): TSH, T4TOTAL, FREET4, T3FREE, THYROIDAB in the last 72 hours. Anemia Panel: No results for input(s): VITAMINB12, FOLATE, FERRITIN, TIBC, IRON, RETICCTPCT in the last 72 hours. Urine analysis:    Component Value Date/Time   COLORURINE YELLOW 05/21/2017 1502   APPEARANCEUR CLEAR 05/21/2017 1502   LABSPEC 1.023 05/21/2017 1502   PHURINE 5.0 05/21/2017 1502   GLUCOSEU NEGATIVE 05/21/2017 1502   HGBUR NEGATIVE 05/21/2017 1502   BILIRUBINUR NEGATIVE 05/21/2017 1502   KETONESUR NEGATIVE 05/21/2017 1502   PROTEINUR NEGATIVE 05/21/2017 1502   NITRITE NEGATIVE 05/21/2017 1502   LEUKOCYTESUR NEGATIVE 05/21/2017 1502   Sepsis Labs: @LABRCNTIP (procalcitonin:4,lacticidven:4)  )No results found for this or any previous visit (from the past 240 hour(s)).       Radiology Studies: Dg Hand Complete Right  Result Date: 05/21/2017 CLINICAL DATA:  Larey Seat yesterday.pain 2nd-4th fingers right hand.no bruising or swelling EXAM: RIGHT HAND - COMPLETE 3+ VIEW COMPARISON:  None. FINDINGS: No evidence of fracture of the carpal or metacarpal bones. Radiocarpal joint is intact. Phalanges are normal. No soft tissue injury. IMPRESSION: No acute osseous abnormality. Electronically Signed   By: Genevive Bi M.D.   On: 05/21/2017 12:53        Scheduled Meds: . allopurinol  100  mg Oral Daily  . heparin subcutaneous  5,000 Units Subcutaneous Q8H  . rosuvastatin  10 mg Oral Daily   Continuous Infusions:   LOS: 1 day    Time spent: 15 minutes    Alberteen Sam, MD [facility] Pager [phone]  If 7PM-7AM, please contact night-coverage [email or web address] Password [email or web address] 05/22/2017, 12:02 PM

## 2017-05-23 LAB — BASIC METABOLIC PANEL
ANION GAP: 4 — AB (ref 5–15)
BUN: 21 mg/dL — ABNORMAL HIGH (ref 6–20)
CALCIUM: 9.1 mg/dL (ref 8.9–10.3)
CO2: 25 mmol/L (ref 22–32)
CREATININE: 1.49 mg/dL — AB (ref 0.61–1.24)
Chloride: 120 mmol/L — ABNORMAL HIGH (ref 101–111)
GFR, EST AFRICAN AMERICAN: 57 mL/min — AB (ref >60)
GFR, EST NON AFRICAN AMERICAN: 49 mL/min — AB (ref >60)
Glucose, Bld: 98 mg/dL (ref 65–99)
Potassium: 3.8 mmol/L (ref 3.5–5.1)
SODIUM: 149 mmol/L — AB (ref 135–145)

## 2017-05-23 NOTE — Discharge Summary (Signed)
Physician Discharge Summary  Orma RenderLawrence Heimsoth ZOX:[id number] DOB: 05-16-1956 DOA: 05/21/2017  PCP: Fleet ContrasAvbuere, Edwin, MD  Admit date: 05/21/2017 Discharge date: 05/23/2017  Admitted From: Home Disposition:  Home  Recommendations for Outpatient Follow-up:  1. Follow up with PCP in 1-2 weeks 2. Please obtain BMP 3. Please note that current DDAVP prescriber from [facility] is not enrolled with [place] [employer] and cannot refill Mr. 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 Over the last year, it appears that his baseline Na is in the high 140s, and he is often admitted for noncompliance with his DDAVP with Na in the 170s and also confusion/falls.    This time, the patient ran out of his last refill of DDAVP (after moving to [place] from [place] this fall) about a month ago.  His new PCP refilled it, but the patient's pharmacy denied it.  His home healthcare agency was unable to follow up on this, and patient/family/HH agency were all unaware that DDAVP is a necessary medicine for the patient (brother at the bedside told me he looked it up online, and had thought it was for bed-wetting).    Then in the last week or so, he has been much more thirsty than usual, but also weak, confused at times, and staggering "like I was drunk", as well as falling several times.  The day of admission, he fell at home, so his brother brought him to the ER where he was found to have sodium 158.  In the hospital, he got D5W at 100 overnight, sodium corrected to 152 overnight.  DDAVP was started the next evening and the morning of discharge his Na was back at his baseline 149 mmol/L.    I called Medicaid and  was able to refill his prescription through my [place] [employer] enrollment (it appears that the patient's [place]CP [place] and [place] [employer], as such, cannot prescribe this nonformulary medication; his prescriber will either need to enroll in [place] [employer] or refer the patient to another PCP)   Discharge Diagnoses:  Active Problems:   Chronic hypernatremia   Diabetes insipidus (HCC)   Sick sinus syndrome (HCC)   CKD (chronic kidney disease) stage 3, GFR 30-59 ml/min (HCC)    Discharge Instructions  Discharge Instructions    Activity as tolerated - No restrictions    Complete by:  As directed    Diet general    Complete by:  As directed    Discharge instructions    Complete by:  As directed    Let Dr. Concepcion ElkAvbuere know that you had trouble picking up your DDAVP prescription last time, because he is not enrolled in [place] [employer].     Allergies as of 05/23/2017   No Known Allergies     Medication List    TAKE these medications   allopurinol 100 MG tablet Commonly known as:  ZYLOPRIM Take 100 mg by mouth daily.   cyclobenzaprine 5 MG tablet Commonly known as:  FLEXERIL Take 1 tablet (5 mg total) by mouth 3 (three) times daily as needed for muscle spasms.   desmopressin 0.01 % solution Commonly known as:  DDAVP NASAL Place 1 spray (10 mcg total) into the nose  2 (two) times daily.   rosuvastatin 10 MG tablet Commonly known as:  CRESTOR Take 10 mg by mouth daily.   tiZANidine 4 MG tablet Commonly known as:  ZANAFLEX Take 4 mg by mouth 2 (two) times daily as needed for muscle spasms.   vitamin B-12 100 MCG tablet Commonly known as:  CYANOCOBALAMIN Take 100 mcg by mouth daily.       No Known Allergies  Consultations:  None   Procedures/Studies: Dg Hand Complete Right  Result Date: 05/21/2017 CLINICAL DATA:  Larey Seat yesterday.pain 2nd-4th fingers right hand.no bruising or swelling EXAM: RIGHT HAND - COMPLETE 3+ VIEW COMPARISON:  None. FINDINGS: No evidence of fracture of the  carpal or metacarpal bones. Radiocarpal joint is intact. Phalanges are normal. No soft tissue injury. IMPRESSION: No acute osseous abnormality. Electronically Signed   By: Genevive Bi M.D.   On: 05/21/2017 12:53       Subjective: Feeling back to normal.  No confusion, no weakness, no dizziness, mental processes clear.  Discharge Exam: Vitals:   05/22/17 2144 05/23/17 0440  BP: (!) 100/48 (!) 103/45  Pulse: (!) 59 (!) 52  Resp: 16 16  Temp: 98.7 F (37.1 C) 98.8 F (37.1 C)  SpO2: 96% 95%   Vitals:   05/22/17 1721 05/22/17 1832 05/22/17 2144 05/23/17 0440  BP:  (!) 97/56 (!) 100/48 (!) 103/45  Pulse: 66 60 (!) 59 (!) 52  Resp:  18 16 16   Temp:  98.4 F (36.9 C) 98.7 F (37.1 C) 98.8 F (37.1 C)  TempSrc:  Oral Oral Oral  SpO2:  96% 96% 95%  Weight:      Height:        General: Pt is alert, awake, not in acute distress Cardiovascular: RRR, S1/S2 +, no rubs, no gallops Respiratory: CTA bilaterally, no wheezing, no rhonchi Abdominal: Soft, NT, ND, bowel sounds + Extremities: no edema, no cyanosis    The results of significant diagnostics from this hospitalization (including imaging, microbiology, ancillary and laboratory) are listed below for reference.     Microbiology: No results found for this or any previous visit (from the past 240 hour(s)).   Labs: BNP (last 3 results) No results for input(s): BNP in the last 8760 hours. Basic Metabolic Panel:  Recent Labs Lab 05/21/17 1215 05/21/17 1943 05/22/17 0022 05/22/17 1610 05/22/17 1248 05/23/17 0647  NA 158* 157* 154*  155* 152* 149* 149*  K 4.0  --  3.6  --   --  3.8  CL 128*  --  122*  --   --  120*  CO2 24  --  26  --   --  25  GLUCOSE 104*  --  113*  --   --  98  BUN 28*  --  27*  --   --  21*  CREATININE 1.55*  --  1.58*  --   --  1.49*  CALCIUM 9.8  --  9.1  --   --  9.1   Liver Function Tests: No results for input(s): AST, ALT, ALKPHOS, BILITOT, PROT, ALBUMIN in the last 168 hours. No  results for input(s): LIPASE, AMYLASE in the last 168 hours. No results for input(s): AMMONIA in the last 168 hours. CBC:  Recent Labs Lab 05/21/17 1215  WBC 6.1  HGB 13.1  HCT 43.0  MCV 93.1  PLT 142*   Cardiac Enzymes: No results for input(s): CKTOTAL, CKMB, CKMBINDEX, TROPONINI in the last 168 hours. BNP: Invalid input(s): POCBNP CBG: No results  for input(s): GLUCAP in the last 168 hours. D-Dimer No results for input(s): DDIMER in the last 72 hours. Hgb A1c No results for input(s): HGBA1C in the last 72 hours. Lipid Profile No results for input(s): CHOL, HDL, LDLCALC, TRIG, CHOLHDL, LDLDIRECT in the last 72 hours. Thyroid function studies No results for input(s): TSH, T4TOTAL, T3FREE, THYROIDAB in the last 72 hours.  Invalid input(s): FREET3 Anemia work up No results for input(s): VITAMINB12, FOLATE, FERRITIN, TIBC, IRON, RETICCTPCT in the last 72 hours. Urinalysis    Component Value Date/Time   COLORURINE YELLOW 05/21/2017 1502   APPEARANCEUR CLEAR 05/21/2017 1502   LABSPEC 1.023 05/21/2017 1502   PHURINE 5.0 05/21/2017 1502   GLUCOSEU NEGATIVE 05/21/2017 1502   HGBUR NEGATIVE 05/21/2017 1502   BILIRUBINUR NEGATIVE 05/21/2017 1502   KETONESUR NEGATIVE 05/21/2017 1502   PROTEINUR NEGATIVE 05/21/2017 1502   NITRITE NEGATIVE 05/21/2017 1502   LEUKOCYTESUR NEGATIVE 05/21/2017 1502   Sepsis Labs Invalid input(s): PROCALCITONIN,  WBC,  LACTICIDVEN Microbiology No results found for this or any previous visit (from the past 240 hour(s)).   Time coordinating discharge: Over 30 minutes  SIGNED:   Alberteen Sam, MD  [facility] 05/23/2017, 2:38 PM   If 7PM-7AM, please contact night-coverage [email or web address] Password [email or web address]

## 2017-05-23 NOTE — Progress Notes (Signed)
Nsg Discharge Note  Admit Date:  05/21/2017 Discharge date: 05/23/2017   Orma RenderLawrence Cotham to be D/C'd Home per MD order.  AVS completed.  Copy for chart, and copy for patient signed, and dated. Patient/caregiver able to verbalize understanding.  Discharge Medication: Allergies as of 05/23/2017   No Known Allergies     Medication List    TAKE these medications   allopurinol 100 MG tablet Commonly known as:  ZYLOPRIM Take 100 mg by mouth daily.   cyclobenzaprine 5 MG tablet Commonly known as:  FLEXERIL Take 1 tablet (5 mg total) by mouth 3 (three) times daily as needed for muscle spasms.   desmopressin 0.01 % solution Commonly known as:  DDAVP NASAL Place 1 spray (10 mcg total) into the nose 2 (two) times daily.   rosuvastatin 10 MG tablet Commonly known as:  CRESTOR Take 10 mg by mouth daily.   tiZANidine 4 MG tablet Commonly known as:  ZANAFLEX Take 4 mg by mouth 2 (two) times daily as needed for muscle spasms.   vitamin B-12 100 MCG tablet Commonly known as:  CYANOCOBALAMIN Take 100 mcg by mouth daily.       Discharge Assessment: Vitals:   05/22/17 2144 05/23/17 0440  BP: (!) 100/48 (!) 103/45  Pulse: (!) 59 (!) 52  Resp: 16 16  Temp: 98.7 F (37.1 C) 98.8 F (37.1 C)  SpO2: 96% 95%   Skin clean, dry and intact without evidence of skin break down, no evidence of skin tears noted. IV catheter discontinued intact. Site without signs and symptoms of complications - no redness or edema noted at insertion site, patient denies c/o pain - only slight tenderness at site.  Dressing with slight pressure applied.  D/c Instructions-Education: Discharge instructions given to patient/family with verbalized understanding. D/c education completed with patient/family including follow up instructions, medication list, d/c activities limitations if indicated, with other d/c instructions as indicated by MD - patient able to verbalize understanding, all questions fully  answered. Patient instructed to return to ED, call 911, or call MD for any changes in condition.  Patient escorted via WC, and D/C home via private auto.  Camillo FlamingVicki L Lenice Koper, RN 05/23/2017 9:28 AM

## 2017-09-04 NOTE — H&P (Signed)
[facility] [address]   [address] [place] [id number] [phone] A Division of [facility]  RE: Samuel RenderORMOND, [facility]agher   [phone]   06/14/2056 PROGRESS NOTE: 08-31-17 Chief complaint: Follow up right knee OA. History of present illness: Patient is a 62 year-old male smoker with known OA to the right knee.  Patient of Dr. Renaye Rakersim Murphy.  He is failing multiple conservative treatments with the right knee, including Corticosteroid injections, Visco supplementation, p.o. NSAIDs and a brace.  Also uses a gait aid with ambulation with a walker.  He is at his wits end with the pain.  Affecting quality of life and activities of daily living.  Here to discuss definitive treatment options.    Review of systems: Ten point review of systems obtained and is negative. Past medical history: 1.  Primary localized osteoarthritis of the right knee.  2.  History of afib, now with a pacemaker.  3.  Gout.  4.  Chronic kidney disease.  5.  Hyperlipidemia.  6.  History of right knee fracture.  He states he had pins at one point.  7.  Chronic tobacco user.   Current medications: Allopurinol, Flexeril, Crestor and Vitamin B12.  No known drug allergies. Family history: Mother positive for heart attack and diabetes.  Father positive for heart attack and hypertension.  Sister with liver problems.  Brother with heart issues. Social history: Current tobacco user.  Everyday smoker, 1-3 cigarettes a day.  Does not drink.  He is single and lives with a relative, his brother.  He does not work.   EXAMINATION: Patient is seated in the exam room in no acute distress.  Alert and oriented.  Appears appropriate age.  Height: 5?7.  Weight: 198 pounds.  Temperature: 98.2.  Pulse: 72.  Respirations: 18.  Blood pressure: 95/62.  HEENT: Pupils are equal, round and reactive to light.  He has no teeth.  Neck is supple with good range of motion.  No cervical adenopathy.  Chest  and lungs are clear to auscultation bilaterally.  Normal breath sounds.  Normal effort.  Cardiac: Irregular rate and rhythm, consistent with his afib.  Abdomen: Soft, non-tender, non-distended.  Active bowel sounds in all four quadrants.  Cranial nerves grossly intact.  Skin: No rashes or lesions.  GU/rectal/breasts: Not indicated for surgery.  Musculoskeletal: Examination of the right lower extremity again shows he is neurovascularly intact.  Intact dorsiflexion and plantarflexion with the ankle.  There is no calf tenderness to palpation.  He has good range of motion with the knee.  Joint line tenderness.  Stable to varus and valgus testing.    X-RAYS: X-rays taken today, four views of the right knee with a sizing ball, show a slight varus with flexion view.  Severe patellofemoral arthritis and what appears to be an old healed proximal fibula fracture.    IMPRESSION: 1. Primary localized osteoarthritis, right knee. 2. History of afib with a pacemaker.  3. Gout. 4. CKD. 5. Hyperlipidemia.    PLAN: Long discussion with the patient today.  Risks and benefits of right total knee arthroplasty.  [facility].  Surgery tentatively scheduled for September 22, 2017.  Preadmission labs on September 09, 2017.  He has obtained primary clearance from Dr. Concepcion ElkAvbuere and cardiology, Dr. Jacinto HalimGanji.  Plan on discharge to a skilled nursing facility, we will leave this up to the social worker at the hospital postoperatively.  DME needs CPM and 3-in-1.  Ancef for antibiotic.  He would be a candidate Tranexamic acid perioperatively.  Pain control and anticoagulation as per protocol - ASA 325 mg.  If he has any other questions or concerns prior to surgery he may contact the office.    Dictated by: Estanislado Spire, PA-C Electronically verified by Burnell Blanks, M.D. WDC(JC):jjh D 09-01-17 T 09-03-17

## 2017-09-09 ENCOUNTER — Encounter (HOSPITAL_COMMUNITY): Payer: Self-pay

## 2017-09-09 ENCOUNTER — Other Ambulatory Visit: Payer: Self-pay

## 2017-09-09 ENCOUNTER — Encounter (HOSPITAL_COMMUNITY): Payer: Self-pay | Admitting: Emergency Medicine

## 2017-09-09 ENCOUNTER — Encounter (HOSPITAL_COMMUNITY)
Admission: RE | Admit: 2017-09-09 | Discharge: 2017-09-09 | Disposition: A | Discharge status: Home / Self Care | Payer: Medicaid Other | Source: Ambulatory Visit | Attending: Orthopedic Surgery | Admitting: Orthopedic Surgery

## 2017-09-09 DIAGNOSIS — M5136 Other intervertebral disc degeneration, lumbar region: Secondary | ICD-10-CM | POA: Insufficient documentation

## 2017-09-09 DIAGNOSIS — D696 Thrombocytopenia, unspecified: Secondary | ICD-10-CM | POA: Insufficient documentation

## 2017-09-09 DIAGNOSIS — E870 Hyperosmolality and hypernatremia: Secondary | ICD-10-CM | POA: Insufficient documentation

## 2017-09-09 DIAGNOSIS — N183 Chronic kidney disease, stage 3 (moderate): Secondary | ICD-10-CM | POA: Diagnosis not present

## 2017-09-09 DIAGNOSIS — Z950 Presence of cardiac pacemaker: Secondary | ICD-10-CM | POA: Insufficient documentation

## 2017-09-09 DIAGNOSIS — Z01812 Encounter for preprocedural laboratory examination: Secondary | ICD-10-CM | POA: Insufficient documentation

## 2017-09-09 DIAGNOSIS — I495 Sick sinus syndrome: Secondary | ICD-10-CM | POA: Diagnosis not present

## 2017-09-09 DIAGNOSIS — D638 Anemia in other chronic diseases classified elsewhere: Secondary | ICD-10-CM | POA: Diagnosis not present

## 2017-09-09 DIAGNOSIS — E232 Diabetes insipidus: Secondary | ICD-10-CM | POA: Insufficient documentation

## 2017-09-09 DIAGNOSIS — M1A9XX0 Chronic gout, unspecified, without tophus (tophi): Secondary | ICD-10-CM | POA: Diagnosis not present

## 2017-09-09 DIAGNOSIS — R7989 Other specified abnormal findings of blood chemistry: Secondary | ICD-10-CM | POA: Diagnosis not present

## 2017-09-09 HISTORY — DX: Anemia, unspecified: D64.9

## 2017-09-09 HISTORY — DX: Disease of spinal cord, unspecified: G95.9

## 2017-09-09 HISTORY — DX: Chronic or unspecified peptic ulcer, site unspecified, with hemorrhage: K27.4

## 2017-09-09 HISTORY — DX: Gastro-esophageal reflux disease without esophagitis: K21.9

## 2017-09-09 HISTORY — DX: Unspecified injury of head, initial encounter: S09.90XA

## 2017-09-09 HISTORY — DX: Chronic or unspecified gastrojejunal ulcer with hemorrhage: K28.4

## 2017-09-09 HISTORY — DX: Unspecified osteoarthritis, unspecified site: M19.90

## 2017-09-09 HISTORY — DX: Hyperosmolality and hypernatremia: E87.0

## 2017-09-09 HISTORY — DX: Radiculopathy, site unspecified: M54.10

## 2017-09-09 LAB — BASIC METABOLIC PANEL
Anion gap: 8 (ref 5–15)
BUN: 25 mg/dL — AB (ref 6–20)
CALCIUM: 9.8 mg/dL (ref 8.9–10.3)
CHLORIDE: 127 mmol/L — AB (ref 101–111)
CO2: 24 mmol/L (ref 22–32)
CREATININE: 1.55 mg/dL — AB (ref 0.61–1.24)
GFR calc non Af Amer: 47 mL/min — ABNORMAL LOW (ref >60)
GFR, EST AFRICAN AMERICAN: 54 mL/min — AB (ref >60)
Glucose, Bld: 92 mg/dL (ref 65–99)
Potassium: 3.7 mmol/L (ref 3.5–5.1)
SODIUM: 159 mmol/L — AB (ref 135–145)

## 2017-09-09 LAB — GLUCOSE, CAPILLARY: Glucose-Capillary: 110 mg/dL — ABNORMAL HIGH (ref 65–99)

## 2017-09-09 LAB — CBC
HCT: 42.3 % (ref 39.0–52.0)
Hemoglobin: 12.3 g/dL — ABNORMAL LOW (ref 13.0–17.0)
MCH: 27.9 pg (ref 26.0–34.0)
MCHC: 29.1 g/dL — ABNORMAL LOW (ref 30.0–36.0)
MCV: 95.9 fL (ref 78.0–100.0)
PLATELETS: 182 10*3/uL (ref 150–400)
RBC: 4.41 MIL/uL (ref 4.22–5.81)
RDW: 15.5 % (ref 11.5–15.5)
WBC: 5 10*3/uL (ref 4.0–10.5)

## 2017-09-09 LAB — HEMOGLOBIN A1C
HEMOGLOBIN A1C: 5.5 % (ref 4.8–5.6)
MEAN PLASMA GLUCOSE: 111.15 mg/dL

## 2017-09-09 LAB — SURGICAL PCR SCREEN
MRSA, PCR: POSITIVE — AB
STAPHYLOCOCCUS AUREUS: POSITIVE — AB

## 2017-09-09 NOTE — Pre-Procedure Instructions (Signed)
[facility]  09/09/2017      [id number] [facility] [facility] [address] [place] [id number] Phone: [phone] Fax: [phone]    Your procedure is scheduled on 09/22/2017.  [facility] at 1230 PM.  Call this number if you have problems the morning of surgery:  [phone]   Remember:  Do not eat food or drink liquids after midnight.   Continue all medications as directed by your physician except follow these medication instructions before surgery below   Take these medicines the morning of surgery with A SIP OF WATER: Allopurinol  7 days prior to surgery STOP taking any Aspirin (unless otherwise instructed by your surgeon), Aleve, Naproxen, Ibuprofen, Motrin, Advil, Goody's, BC's, all herbal medications, fish oil, and all vitamins    Do not wear jewelry.  Do not wear lotions, powders, or colognes, or deodorant.  Men may shave face and neck.  Do not bring valuables to the hospital.  [facility] is not responsible for any belongings or valuables.  Hearing aids, eyeglasses, contacts, dentures or partials may not be worn into surgery.  Leave your suitcase in the car.  After surgery it may be brought to your room.  For patients admitted to the hospital, discharge time will be determined by your treatment team.  Patients discharged the day of surgery will not be allowed to drive home.   Name and phone number of your driver:    Special instructions:   [place]- Preparing For Surgery  Before surgery, you can play an important role. Because skin is not sterile, your skin needs to be as free of germs as possible. You can reduce the number of germs on your skin by washing with CHG (chlorahexidine gluconate) Soap before surgery.  CHG is an antiseptic cleaner which kills germs and bonds with the skin to continue killing germs even after washing.  Please do not use if you have an allergy to CHG or  antibacterial soaps. If your skin becomes reddened/irritated stop using the CHG.  Do not shave (including legs and underarms) for at least 48 hours prior to first CHG shower. It is OK to shave your face.  Please follow these instructions carefully.   1. Shower the NIGHT BEFORE SURGERY and the MORNING OF SURGERY with CHG.   2. If you chose to wash your hair, wash your hair first as usual with your normal shampoo.  3. After you shampoo, rinse your hair and body thoroughly to remove the shampoo.  4. Use CHG as you would any other liquid soap. You can apply CHG directly to the skin and wash gently with a scrungie or a clean washcloth.   5. Apply the CHG Soap to your body ONLY FROM THE NECK DOWN.  Do not use on open wounds or open sores. Avoid contact with your eyes, ears, mouth and genitals (private parts). Wash Face and genitals (private parts)  with your normal soap.  6. Wash thoroughly, paying special attention to the area where your surgery will be performed.  7. Thoroughly rinse your body with warm water from the neck down.  8. DO NOT shower/wash with your normal soap after using and rinsing off the CHG Soap.  9. Pat yourself dry with a CLEAN TOWEL.  10. Wear CLEAN PAJAMAS to bed the night before surgery, wear comfortable clothes the morning of surgery  11. Place CLEAN SHEETS on your bed the night of your  first shower and DO NOT SLEEP WITH PETS.    Day of Surgery: Shower as stated above. Do not apply any deodorants/lotions.  Please wear clean clothes to the hospital/surgery center.      Please read over the following fact sheets that you were given.

## 2017-09-09 NOTE — Pre-Procedure Instructions (Signed)
Orma RenderLawrence Cassaro  09/09/2017      [id number] [place], [place] - [address] [place] [address] Phone: [phone] Fax: [phone]    Your procedure is scheduled on 09/22/2017.  [facility] at 1230 PM.  Call this number if you have problems the morning of surgery:  [phone]   Remember:  Do not eat food or drink liquids after midnight.   Continue all medications as directed by your physician except follow these medication instructions before surgery below   Take these medicines the morning of surgery with A SIP OF WATER:  Allopurinol DDAVP ([place])   7 days prior to surgery STOP taking any Aspirin (unless otherwise instructed by your surgeon), Aleve, Naproxen, Ibuprofen, Motrin, Advil, Goody's, BC's, all herbal medications, fish oil, and all vitamins  NO smoking 24 hours prior to surgery.   Do not wear jewelry.  Do not wear lotions, powders, or colognes, or deodorant.  Men may shave face and neck.  Do not bring valuables to the hospital.  [facility] is not responsible for any belongings or valuables.  Hearing aids, eyeglasses, contacts, dentures or partials may not be worn into surgery.  Leave your suitcase in the car.  After surgery it may be brought to your room.  For patients admitted to the hospital, discharge time will be determined by your treatment team.  Patients discharged the day of surgery will not be allowed to drive home.   Name and phone number of your driver:    Special instructions:   [place]- Preparing For Surgery  Before surgery, you can play an important role. Because skin is not sterile, your skin needs to be as free of germs as possible. You can reduce the number of germs on your skin by washing with CHG (chlorahexidine gluconate) Soap before surgery.  CHG is an antiseptic cleaner which kills germs and bonds with the skin to continue killing germs even after  washing.  Please do not use if you have an allergy to CHG or antibacterial soaps. If your skin becomes reddened/irritated stop using the CHG.  Do not shave (including legs and underarms) for at least 48 hours prior to first CHG shower. It is OK to shave your face.  Please follow these instructions carefully.   1. Shower the NIGHT BEFORE SURGERY and the MORNING OF SURGERY with CHG.   2. If you chose to wash your hair, wash your hair first as usual with your normal shampoo.  3. After you shampoo, rinse your hair and body thoroughly to remove the shampoo.  4. Use CHG as you would any other liquid soap. You can apply CHG directly to the skin and wash gently with a scrungie or a clean washcloth.   5. Apply the CHG Soap to your body ONLY FROM THE NECK DOWN.  Do not use on open wounds or open sores. Avoid contact with your eyes, ears, mouth and genitals (private parts). Wash Face and genitals (private parts)  with your normal soap.  6. Wash thoroughly, paying special attention to the area where your surgery will be performed.  7. Thoroughly rinse your body with warm water from the neck down.  8. DO NOT shower/wash with your normal soap after using and rinsing off the CHG Soap.  9. Pat yourself dry with a CLEAN TOWEL.  10. Wear CLEAN PAJAMAS to bed the night before surgery, wear comfortable clothes the morning of surgery  11. Place CLEAN SHEETS on your bed the night of your first shower and DO NOT SLEEP WITH PETS.    Day of Surgery: Shower as stated above. Do not apply any deodorants/lotions.  Please wear clean clothes to the hospital/surgery center.      Please read over the following fact sheets that you were given.

## 2017-09-09 NOTE — Progress Notes (Signed)
[facility] [address] [facility] positive PCR of MRSA and Staph. Left message on pt's voicemail informing him of results and need to pick up Rx.

## 2017-09-09 NOTE — Progress Notes (Signed)
PCP: Dr. Mila PalmerAbvuere, last saw Sep 03 2017, for pre-op clearance. Requested Records of last office visit. Cardiologist: Dr. Jacinto HalimGanji, pt missed appointment scheduled for Sep 09, 2017.  Instructed pt he must reschedule appointment as soon as possible prior to surgery.  Tresa EndoKelly High, surgical scheduler called and message left making aware of missed cardiology appt.    EKG: 05/22/2017 ECHO: 04/2010--[facility] Stress Test: 08/2001--[facility] Cardiac Cath: [facility] [place], [place], pt reports no stents placed.  Records requested.  Pt reports he had pacemaker [place] in 2011 [place]. Pt unable to recall reason for pacemaker insertion. Upon review of [facility] records, pt has hx of sick sinus syndrome.  Dr. Verl DickerGanji's office reported last device check was in Mar 26, 2017. Called and spoke with Apolinar JunesBrandon with [employer], performed a device check during PAT appointment.  Results of device check faxed to Dr. Verl DickerGanji's office.  Secure email sent to [employer] reps making aware of surgery date/time.  Pt reports hx hypotension. BP range during appt time, 80/35-->107/29-->manual 90/54.  Marylene LandAngela NP with anesthesia made aware.  Pt denies dizziness, pink moist mucous membranes, reports eating and drinking without issues.   Patient denies shortness of breath, fever, cough, and chest pain at PAT appointment.  Patient verbalized understanding of instructions provided today at the PAT appointment.  Patient asked to review instructions at home and day of surgery.

## 2017-09-10 NOTE — Progress Notes (Signed)
Anesthesia Chart Review:  Pt is a 62 year old male scheduled for R total knee arthroplasty on 09/22/2017 with Margarita Ranaimothy Murphy, MD  - PCP is Fleet ContrasEdwin Avbuere, MD who has cleared pt for surgery - Cardiologist is Delrae RendJagadeesh Ganji, MD who has cleared pt for surgery. Initial/only office visit 03/11/17  PMH includes:  Sick sinus syndrome, pacemaker ([employer]tr[employer], implanted 2011 at [facility] in [place], [place] hypotension, hyperlipidemia, diabetes insipidus, anemia, GERD. Current smoker. BMI 28  - Hospitalized 10/25-10/27/18 for high Na, uncontrolled diabetes insipidus  - Hospitalized 9/17-9/19/18 for high Na, uncontrolled diabetes insipidus  - Hospitalized 5/26-5/30/19 for high Na, uncontrolled diabetes insipidus, hypotension, acute on chronic kidney disease  - Hospitalized 3/22-3/29/18 for high Na, uncontrolled diabetes insipidus (see care everywhere)  - Hospitalized 2/7-215/18 for high Na, uncontrolled diabetes insipidus, abdominal pain, acute kidney injury (see care everywhere)   Medications include: DDAVP, rosuvastatin  BP (!) 90/54 Comment: Khayla Koppenhaver NP made aware  Pulse 69   Temp 36.9 C   Resp 20   Ht 5\' 7"  (1.702 m)   Wt 178 lb (80.7 kg)   SpO2 99%   BMI 27.88 kg/m   Preoperative labs reviewed.   - Na 159, Cl 127.   I spoke with PCP Dr. Concepcion ElkAvbuere.  He will contact pt about elevated Na, and f/u with pt.  I will recheck Na day of surgery. I notified Tresa EndoKelly in Dr. Greig RightMurphy's office of high Na.     CXR 04/14/17:  - Bibasilar linear atelectasis with some linear scarring present. No definite active process.  EKG 05/21/17: Atrial-paced rhythm with frequent PVCs  Stress echo 01/07/16 (care everywhere):  - Pt had stress echo done by increasing atrial pacing through Dual chamber PPM. - Ejection Fraction estimate at rest is 45-50%. - Normal augmentation of all left ventricular wall segments postexercise. - Ejection Fraction estimate is 45-50% at maximum HR. - Normal augmentation of all left  ventricular wall segments during and post stress. - No wall motion abnormaliies noted.   If labs acceptable day of surgery, I anticipate pt can proceed with surgery as scheduled.   Rica Mastngela Torrell Krutz, FNP-BC [facility]Anesthesiology Phone: [phone] 09/10/2017 3:01 PM

## 2017-09-16 ENCOUNTER — Encounter (HOSPITAL_COMMUNITY): Payer: Self-pay | Admitting: Emergency Medicine

## 2017-09-16 ENCOUNTER — Other Ambulatory Visit: Payer: Self-pay

## 2017-09-16 ENCOUNTER — Inpatient Hospital Stay (HOSPITAL_COMMUNITY)
Admission: EM | Admit: 2017-09-16 | Discharge: 2017-09-18 | DRG: 644 | Disposition: A | Discharge status: Home Health | Payer: Medicaid Other | Attending: Internal Medicine | Admitting: Internal Medicine

## 2017-09-16 DIAGNOSIS — F17210 Nicotine dependence, cigarettes, uncomplicated: Secondary | ICD-10-CM | POA: Diagnosis present

## 2017-09-16 DIAGNOSIS — N183 Chronic kidney disease, stage 3 (moderate): Secondary | ICD-10-CM | POA: Diagnosis present

## 2017-09-16 DIAGNOSIS — M1710 Unilateral primary osteoarthritis, unspecified knee: Secondary | ICD-10-CM | POA: Diagnosis present

## 2017-09-16 DIAGNOSIS — K219 Gastro-esophageal reflux disease without esophagitis: Secondary | ICD-10-CM | POA: Diagnosis present

## 2017-09-16 DIAGNOSIS — E870 Hyperosmolality and hypernatremia: Secondary | ICD-10-CM | POA: Diagnosis present

## 2017-09-16 DIAGNOSIS — M109 Gout, unspecified: Secondary | ICD-10-CM | POA: Diagnosis present

## 2017-09-16 DIAGNOSIS — Z8349 Family history of other endocrine, nutritional and metabolic diseases: Secondary | ICD-10-CM

## 2017-09-16 DIAGNOSIS — E232 Diabetes insipidus: Secondary | ICD-10-CM | POA: Diagnosis present

## 2017-09-16 DIAGNOSIS — Z950 Presence of cardiac pacemaker: Secondary | ICD-10-CM | POA: Diagnosis present

## 2017-09-16 DIAGNOSIS — Z87820 Personal history of traumatic brain injury: Secondary | ICD-10-CM

## 2017-09-16 DIAGNOSIS — D649 Anemia, unspecified: Secondary | ICD-10-CM | POA: Diagnosis present

## 2017-09-16 DIAGNOSIS — Z9114 Patient's other noncompliance with medication regimen: Secondary | ICD-10-CM

## 2017-09-16 DIAGNOSIS — N179 Acute kidney failure, unspecified: Secondary | ICD-10-CM | POA: Diagnosis not present

## 2017-09-16 DIAGNOSIS — S0990XA Unspecified injury of head, initial encounter: Secondary | ICD-10-CM | POA: Diagnosis present

## 2017-09-16 DIAGNOSIS — E860 Dehydration: Secondary | ICD-10-CM | POA: Diagnosis present

## 2017-09-16 DIAGNOSIS — D696 Thrombocytopenia, unspecified: Secondary | ICD-10-CM | POA: Diagnosis present

## 2017-09-16 DIAGNOSIS — E785 Hyperlipidemia, unspecified: Secondary | ICD-10-CM | POA: Diagnosis present

## 2017-09-16 LAB — BASIC METABOLIC PANEL
ANION GAP: 9 (ref 5–15)
BUN: 26 mg/dL — ABNORMAL HIGH (ref 6–20)
CHLORIDE: 123 mmol/L — AB (ref 101–111)
CO2: 25 mmol/L (ref 22–32)
Calcium: 9.3 mg/dL (ref 8.9–10.3)
Creatinine, Ser: 1.88 mg/dL — ABNORMAL HIGH (ref 0.61–1.24)
GFR calc Af Amer: 43 mL/min — ABNORMAL LOW (ref >60)
GFR calc non Af Amer: 37 mL/min — ABNORMAL LOW (ref >60)
GLUCOSE: 117 mg/dL — AB (ref 65–99)
POTASSIUM: 3.9 mmol/L (ref 3.5–5.1)
Sodium: 157 mmol/L — ABNORMAL HIGH (ref 135–145)

## 2017-09-16 LAB — COMPREHENSIVE METABOLIC PANEL
ALT: 15 U/L — ABNORMAL LOW (ref 17–63)
ANION GAP: 9 (ref 5–15)
AST: 21 U/L (ref 15–41)
Albumin: 2.9 g/dL — ABNORMAL LOW (ref 3.5–5.0)
Alkaline Phosphatase: 134 U/L — ABNORMAL HIGH (ref 38–126)
BILIRUBIN TOTAL: 0.3 mg/dL (ref 0.3–1.2)
BUN: 25 mg/dL — AB (ref 6–20)
CHLORIDE: 126 mmol/L — AB (ref 101–111)
CO2: 24 mmol/L (ref 22–32)
Calcium: 9.6 mg/dL (ref 8.9–10.3)
Creatinine, Ser: 1.84 mg/dL — ABNORMAL HIGH (ref 0.61–1.24)
GFR, EST AFRICAN AMERICAN: 44 mL/min — AB (ref >60)
GFR, EST NON AFRICAN AMERICAN: 38 mL/min — AB (ref >60)
Glucose, Bld: 121 mg/dL — ABNORMAL HIGH (ref 65–99)
POTASSIUM: 3.9 mmol/L (ref 3.5–5.1)
Sodium: 159 mmol/L — ABNORMAL HIGH (ref 135–145)
TOTAL PROTEIN: 7.3 g/dL (ref 6.5–8.1)

## 2017-09-16 LAB — I-STAT CHEM 8, ED
BUN: 31 mg/dL — ABNORMAL HIGH (ref 6–20)
CALCIUM ION: 1.24 mmol/L (ref 1.15–1.40)
Chloride: 124 mmol/L — ABNORMAL HIGH (ref 101–111)
Creatinine, Ser: 1.7 mg/dL — ABNORMAL HIGH (ref 0.61–1.24)
GLUCOSE: 112 mg/dL — AB (ref 65–99)
HEMATOCRIT: 41 % (ref 39.0–52.0)
Hemoglobin: 13.9 g/dL (ref 13.0–17.0)
Potassium: 4 mmol/L (ref 3.5–5.1)
SODIUM: 161 mmol/L — AB (ref 135–145)
TCO2: 27 mmol/L (ref 22–32)

## 2017-09-16 LAB — CBC
HEMATOCRIT: 44.2 % (ref 39.0–52.0)
Hemoglobin: 12.8 g/dL — ABNORMAL LOW (ref 13.0–17.0)
MCH: 27.9 pg (ref 26.0–34.0)
MCHC: 29 g/dL — AB (ref 30.0–36.0)
MCV: 96.5 fL (ref 78.0–100.0)
Platelets: 128 10*3/uL — ABNORMAL LOW (ref 150–400)
RBC: 4.58 MIL/uL (ref 4.22–5.81)
RDW: 15.6 % — AB (ref 11.5–15.5)
WBC: 4.7 10*3/uL (ref 4.0–10.5)

## 2017-09-16 LAB — OSMOLALITY: OSMOLALITY: 336 mosm/kg — AB (ref 275–295)

## 2017-09-16 LAB — I-STAT TROPONIN, ED: TROPONIN I, POC: 0 ng/mL (ref 0.00–0.08)

## 2017-09-16 MED ORDER — ONDANSETRON HCL 4 MG PO TABS: 4.0000 mg | ORAL_TABLET | Freq: Four times a day (QID) | ORAL | Status: DC | PRN

## 2017-09-16 MED ORDER — ROSUVASTATIN CALCIUM 10 MG PO TABS
10.0000 mg | ORAL_TABLET | Freq: Every day | ORAL | Status: DC
  Administered 2017-09-16 – 2017-09-18 (×3): 10 mg via ORAL
  Filled 2017-09-16 (×3): qty 1

## 2017-09-16 MED ORDER — VITAMIN B-12 1000 MCG PO TABS
1000.0000 ug | ORAL_TABLET | Freq: Every day | ORAL | Status: DC
  Administered 2017-09-16 – 2017-09-18 (×3): 1000 ug via ORAL
  Filled 2017-09-16 (×3): qty 1

## 2017-09-16 MED ORDER — DEXTROSE 5 % IV SOLN
INTRAVENOUS | Status: DC
  Administered 2017-09-16: 23:00:00 via INTRAVENOUS

## 2017-09-16 MED ORDER — ENOXAPARIN SODIUM 40 MG/0.4ML ~~LOC~~ SOLN
40.0000 mg | SUBCUTANEOUS | Status: DC
  Administered 2017-09-16 – 2017-09-17 (×2): 40 mg via SUBCUTANEOUS
  Filled 2017-09-16 (×3): qty 0.4

## 2017-09-16 MED ORDER — CYCLOBENZAPRINE HCL 5 MG PO TABS: 5.0000 mg | ORAL_TABLET | Freq: Three times a day (TID) | ORAL | Status: DC | PRN

## 2017-09-16 MED ORDER — ONDANSETRON HCL 4 MG/2ML IJ SOLN: 4.0000 mg | Freq: Four times a day (QID) | INTRAMUSCULAR | Status: DC | PRN

## 2017-09-16 MED ORDER — ACETAMINOPHEN 325 MG PO TABS: 650.0000 mg | ORAL_TABLET | Freq: Four times a day (QID) | ORAL | Status: DC | PRN

## 2017-09-16 MED ORDER — ALLOPURINOL 100 MG PO TABS
100.0000 mg | ORAL_TABLET | Freq: Every day | ORAL | Status: DC
  Administered 2017-09-16 – 2017-09-18 (×3): 100 mg via ORAL
  Filled 2017-09-16 (×3): qty 1

## 2017-09-16 MED ORDER — ACETAMINOPHEN 650 MG RE SUPP: 650.0000 mg | Freq: Four times a day (QID) | RECTAL | Status: DC | PRN

## 2017-09-16 MED ORDER — SODIUM CHLORIDE 0.9% FLUSH
3.0000 mL | Freq: Two times a day (BID) | INTRAVENOUS | Status: DC
  Administered 2017-09-17: 3 mL via INTRAVENOUS

## 2017-09-16 MED ORDER — DESMOPRESSIN ACETATE 0.1 MG PO TABS
0.0500 mg | ORAL_TABLET | Freq: Two times a day (BID) | ORAL | Status: DC
  Administered 2017-09-16 – 2017-09-18 (×4): 0.05 mg via ORAL
  Filled 2017-09-16 (×7): qty 1

## 2017-09-16 MED ORDER — DEXTROSE 5 % IV SOLN
Freq: Once | INTRAVENOUS | Status: AC
  Administered 2017-09-16: 100 mL via INTRAVENOUS

## 2017-09-16 NOTE — ED Triage Notes (Signed)
Patient presents to ED from PCP for assessment for hypernatremia.  Also noted to have HR as low at 36 in triage.  Patient axox4.
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
## 2017-09-16 NOTE — ED Notes (Signed)
Attempted report 
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
## 2017-09-16 NOTE — ED Notes (Signed)
Silverio LayYao, MD aware of osmolality 336 & Sodium levels

## 2017-09-17 ENCOUNTER — Encounter (HOSPITAL_COMMUNITY): Payer: Self-pay | Admitting: General Practice

## 2017-09-17 ENCOUNTER — Other Ambulatory Visit: Payer: Self-pay

## 2017-09-17 DIAGNOSIS — E870 Hyperosmolality and hypernatremia: Secondary | ICD-10-CM

## 2017-09-17 DIAGNOSIS — M1710 Unilateral primary osteoarthritis, unspecified knee: Secondary | ICD-10-CM | POA: Diagnosis present

## 2017-09-17 DIAGNOSIS — D696 Thrombocytopenia, unspecified: Secondary | ICD-10-CM | POA: Diagnosis present

## 2017-09-17 DIAGNOSIS — D649 Anemia, unspecified: Secondary | ICD-10-CM | POA: Diagnosis present

## 2017-09-17 DIAGNOSIS — M109 Gout, unspecified: Secondary | ICD-10-CM | POA: Diagnosis present

## 2017-09-17 DIAGNOSIS — Z9114 Patient's other noncompliance with medication regimen: Secondary | ICD-10-CM | POA: Diagnosis not present

## 2017-09-17 DIAGNOSIS — Z8349 Family history of other endocrine, nutritional and metabolic diseases: Secondary | ICD-10-CM | POA: Diagnosis not present

## 2017-09-17 DIAGNOSIS — Z87820 Personal history of traumatic brain injury: Secondary | ICD-10-CM | POA: Diagnosis not present

## 2017-09-17 DIAGNOSIS — N179 Acute kidney failure, unspecified: Secondary | ICD-10-CM

## 2017-09-17 DIAGNOSIS — E876 Hypokalemia: Secondary | ICD-10-CM | POA: Diagnosis not present

## 2017-09-17 DIAGNOSIS — E860 Dehydration: Secondary | ICD-10-CM | POA: Diagnosis present

## 2017-09-17 DIAGNOSIS — E232 Diabetes insipidus: Secondary | ICD-10-CM | POA: Diagnosis present

## 2017-09-17 DIAGNOSIS — F17210 Nicotine dependence, cigarettes, uncomplicated: Secondary | ICD-10-CM | POA: Diagnosis present

## 2017-09-17 DIAGNOSIS — E785 Hyperlipidemia, unspecified: Secondary | ICD-10-CM | POA: Diagnosis present

## 2017-09-17 DIAGNOSIS — N183 Chronic kidney disease, stage 3 (moderate): Secondary | ICD-10-CM | POA: Diagnosis present

## 2017-09-17 DIAGNOSIS — N189 Chronic kidney disease, unspecified: Secondary | ICD-10-CM | POA: Diagnosis not present

## 2017-09-17 DIAGNOSIS — K219 Gastro-esophageal reflux disease without esophagitis: Secondary | ICD-10-CM | POA: Diagnosis present

## 2017-09-17 DIAGNOSIS — Z950 Presence of cardiac pacemaker: Secondary | ICD-10-CM | POA: Diagnosis not present

## 2017-09-17 LAB — URINALYSIS, ROUTINE W REFLEX MICROSCOPIC
BILIRUBIN URINE: NEGATIVE
GLUCOSE, UA: NEGATIVE mg/dL
Hgb urine dipstick: NEGATIVE
KETONES UR: NEGATIVE mg/dL
Leukocytes, UA: NEGATIVE
Nitrite: NEGATIVE
PH: 5 (ref 5.0–8.0)
Protein, ur: NEGATIVE mg/dL
Specific Gravity, Urine: 1.019 (ref 1.005–1.030)

## 2017-09-17 LAB — BASIC METABOLIC PANEL
ANION GAP: 10 (ref 5–15)
ANION GAP: 7 (ref 5–15)
BUN: 24 mg/dL — ABNORMAL HIGH (ref 6–20)
BUN: 21 mg/dL — ABNORMAL HIGH (ref 6–20)
CALCIUM: 8.7 mg/dL — AB (ref 8.9–10.3)
CHLORIDE: 118 mmol/L — AB (ref 101–111)
CO2: 22 mmol/L (ref 22–32)
CO2: 24 mmol/L (ref 22–32)
Calcium: 8.9 mg/dL (ref 8.9–10.3)
Chloride: 116 mmol/L — ABNORMAL HIGH (ref 101–111)
Creatinine, Ser: 1.7 mg/dL — ABNORMAL HIGH (ref 0.61–1.24)
Creatinine, Ser: 1.67 mg/dL — ABNORMAL HIGH (ref 0.61–1.24)
GFR calc Af Amer: 48 mL/min — ABNORMAL LOW (ref >60)
GFR calc non Af Amer: 42 mL/min — ABNORMAL LOW (ref >60)
GFR calc non Af Amer: 43 mL/min — ABNORMAL LOW (ref >60)
GFR, EST AFRICAN AMERICAN: 49 mL/min — AB (ref >60)
GLUCOSE: 119 mg/dL — AB (ref 65–99)
Glucose, Bld: 104 mg/dL — ABNORMAL HIGH (ref 65–99)
POTASSIUM: 3.7 mmol/L (ref 3.5–5.1)
POTASSIUM: 3.6 mmol/L (ref 3.5–5.1)
SODIUM: 147 mmol/L — AB (ref 135–145)
Sodium: 150 mmol/L — ABNORMAL HIGH (ref 135–145)

## 2017-09-17 LAB — OSMOLALITY, URINE: Osmolality, Ur: 738 mOsm/kg (ref 300–900)

## 2017-09-17 MED ORDER — DEXTROSE 5 % IV SOLN
INTRAVENOUS | Status: AC
Start: 2017-09-17 — End: 2017-09-18
  Administered 2017-09-17 (×2): via INTRAVENOUS

## 2017-09-17 NOTE — Evaluation (Signed)
Physical Therapy Evaluation Patient Details Name: Samuel RenderLawrence Burbage MRN: [id number] DOB: 24-Apr-1956 Today's Date: 09/17/2017   History of Present Illness  Pt is a 62 y/o male admitted with hyperkalemia, PMH significant for TBI, sick sinus syndrome with PPM, and cranial diabetes insipidus  Clinical Impression  Pt with very high fall risk given current functional mobility status.  Pt reports using RW prior to admission.  In this clinician's opinion, RW may not provide enough support given pt's significant ataxia and decreased coordination during ambulation.  Discussed safe discharge plan extensively with pt and brother.  Per pt, he is to go for stress test Monday and elective TKA on Tuesday (2/26).  Given unliklihood that post acute rehab could make a significant impact in such a short time, recommend home with 24/7 physical assist and use of w/c for any distance longer than short house hold distances.  Then, following TKA, pt should follow up with post acute rehab (preferrable CIR level) to minimize fall risk and decrease burden of care prior to returning home.  Discussed with pt and his brother, that physical assist implies that a care giver would have their hands on pt whenever he is transferring/ambulating and both verbalized understanding.      Follow Up Recommendations Supervision/Assistance - 24 hour;Home health PT    Equipment Recommendations  Wheelchair (measurements PT);Wheelchair cushion (measurements PT)(18x18 w/c)    Recommendations for Other Services OT consult     Precautions / Restrictions Precautions Precautions: Fall Precaution Comments: Pt high fall risk with ataxic gait and poor safety awareness Restrictions Weight Bearing Restrictions: No      Mobility  Bed Mobility Overal bed mobility: Modified Independent             General bed mobility comments: HOB elevated, use of bed rails  Transfers Overall transfer level: Needs assistance Equipment used: 1 person hand  held assist Transfers: Sit to/from Stand Sit to Stand: Min guard         General transfer comment: close min guard for safety due to poor balance during transition  Ambulation/Gait Ambulation/Gait assistance: Mod assist Ambulation Distance (Feet): 20 Feet Assistive device: 1 person hand held assist Gait Pattern/deviations: Ataxic;Leaning posteriorly;Drifts right/left;Trunk flexed;Wide base of support Gait velocity: slow   General Gait Details: Reports his gait has worsened over the last several months and he was using a Rw at least outside of the home.    Stairs            Wheelchair Mobility    Modified Rankin (Stroke Patients Only)       Balance Overall balance assessment: Needs assistance Sitting-balance support: No upper extremity supported;Feet supported Sitting balance-Leahy Scale: Poor Sitting balance - Comments: posterior LOB when attempting to fasten pants Postural control: Posterior lean Standing balance support: Bilateral upper extremity supported;Single extremity supported Standing balance-Leahy Scale: Zero                               Pertinent Vitals/Pain Pain Assessment: No/denies pain    Home Living Family/patient expects to be discharged to:: Private residence Living Arrangements: Other relatives Available Help at Discharge: Family;Available 24 hours/day Type of Home: House Home Access: Level entry     Home Layout: One level Home Equipment: Walker - 2 wheels;Cane - single point;Shower seat      Prior Function Level of Independence: Independent with assistive device(s)         Comments: reports he lives with his  sister, uses RW for ambulation in community     Hand Dominance   Dominant Hand: Right    Extremity/Trunk Assessment        Lower Extremity Assessment Lower Extremity Assessment: RLE deficits/detail;LLE deficits/detail RLE Coordination: decreased gross motor LLE Coordination: decreased gross motor        Communication   Communication: No difficulties  Cognition Arousal/Alertness: Awake/alert Behavior During Therapy: WFL for tasks assessed/performed Overall Cognitive Status: History of cognitive impairments - at baseline                                        General Comments      Exercises     Assessment/Plan    PT Assessment Patient needs continued PT services  PT Problem List Decreased balance;Decreased mobility;Decreased coordination;Decreased cognition;Decreased knowledge of use of DME;Decreased safety awareness       PT Treatment Interventions Gait training;DME instruction;Functional mobility training;Therapeutic activities;Therapeutic exercise;Balance training    PT Goals (Current goals can be found in the Care Plan section)  Acute Rehab PT Goals Patient Stated Goal: to go home today PT Goal Formulation: With patient Time For Goal Achievement: 09/24/17 Potential to Achieve Goals: Fair    Frequency Min 3X/week   Barriers to discharge Other (comment)(significant level of care)      Co-evaluation               AM-PAC PT "6 Clicks" Daily Activity  Outcome Measure Difficulty turning over in bed (including adjusting bedclothes, sheets and blankets)?: None Difficulty moving from lying on back to sitting on the side of the bed? : A Little Difficulty sitting down on and standing up from a chair with arms (e.g., wheelchair, bedside commode, etc,.)?: A Little Help needed moving to and from a bed to chair (including a wheelchair)?: A Little Help needed walking in hospital room?: A Lot Help needed climbing 3-5 steps with a railing? : Total 6 Click Score: 16    End of Session   Activity Tolerance: Patient tolerated treatment well Patient left: in bed;with bed alarm set;with family/visitor present Nurse Communication: Mobility status;Other (comment) PT Visit Diagnosis: Unsteadiness on feet (R26.81);Repeated falls (R29.6);Dizziness and  giddiness (R42)    Time: 4098-1191 PT Time Calculation (min) (ACUTE ONLY): 30 min   Charges:   PT Evaluation $PT Eval Moderate Complexity: 1 Mod PT Treatments $Therapeutic Activity: 8-22 mins   PT G Codes:   PT G-Codes **NOT FOR INPATIENT CLASS** Functional Assessment Tool Used: AM-PAC 6 Clicks Basic Mobility Functional Limitation: Mobility: Walking and moving around Mobility: Walking and Moving Around Current Status (Y7829): At least 40 percent but less than 60 percent impaired, limited or restricted Mobility: Walking and Moving Around Goal Status [phone]): At least 20 percent but less than 40 percent impaired, limited or restricted     Stephania Fragmin 09/17/2017, 4:12 PM

## 2017-09-17 NOTE — Progress Notes (Signed)
PROGRESS NOTE   Samuel Gallagher  ZOX:[id number]    DOB: January 08, 1956    DOA: 09/16/2017  PCP: [name], MD   I have briefly reviewed patients previous medical records in [facility].  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  Patient feeling weaker, tired, increased urinary output.  Seen by PCP on 2/19, labs drawn, noted to have high sodium and advised to come to the ED.  Admitted for hypovolemic hyponatremia due to uncontrolled cranial GI.  Improving.   Assessment & Plan:   Principal Problem:   Acute kidney injury (HCC) Active Problems:   DI (diabetes insipidus) (HCC)   Acute hypernatremia   Pacemaker   HLD (hyperlipidemia)   TBI   1. Cranial DI with recurrent hypernatremia: Serum osmolarity 336, urine osmolarity 738.  Presented with serum sodium 159, chloride 126, creatinine 1.84.  Started on IV D5 infusion at 125 mils per hour and oral desmopressin at 0.05 mg twice daily.  Liberal water intake.  Sodium has improved to 150.  Reduce IV D5 infusion to 75 mL/h.  Monitor BMP closely.  Discussed with RN and patient and advised liberal free water intake.  Clinically improving, decreased urine output. 2. Acute on stage III chronic kidney disease: Baseline creatinine probably in the 1.4-1.5 range.  Presented with creatinine of 1.84.  Likely due to dehydration from problem #1.  IV fluids.  Improving/creatinine 1.7.  Follow BMP in a.m. 3. Thrombocytopenia, chronic/intermittent: Stable.  Follow CBC periodically. 4. Hyperlipidemia: Continue statins. 5. Sick sinus syndrome status post PPM: Intermittent asymptomatic  bradycardia noted.  Placed on telemetry for overnight monitoring. 6. Knee arthritis: Patient reports that he is supposed to have stress test on 2/22 for possible knee surgery by Dr. Eulah Pont on 2/26.  Advised him to call and reschedule stress test for early next week.   DVT prophylaxis: Lovenox Code Status: Full Family Communication: None at bedside Disposition: Pending clinical improvement, possibly 2/22.   Consultants:  None  Procedures:  None  Antimicrobials:  None   Subjective: Feels better.  Reports significantly decreased urinary frequency.  No dizziness, lightheadedness, chest pain or dyspnea.  Reports compliance to his medications as outpatient.  States that he lives with his sister and ambulates with the help of a walker.  ROS: As above.  Objective:  Vitals:   09/16/17 1630 09/16/17 1856 09/16/17 2048 09/17/17 0405  BP: (!) 97/54 (!) 92/48 (!) 99/44 (!) 92/45  Pulse: (!) 25 (!) 54 (!) 36 76  Resp: (!) 22 20 20 19   Temp:  98.1 F (36.7 C) 98.6 F (37 C) 98.5 F (36.9 C)  TempSrc:  Oral Oral   SpO2: 100% 99% 100% 93%  Weight:    79.9 kg (176 lb 2.4 oz)    Examination:  General exam: Pleasant middle-aged male, moderately built and nourished, lying comfortably propped up in bed.  Oral mucosa with borderline hydration. Respiratory system: Clear to auscultation. Respiratory effort normal. Cardiovascular system: S1 & S2 heard, RRR. No JVD, murmurs, rubs, gallops or clicks. No pedal edema. Gastrointestinal system: Abdomen is nondistended, soft and nontender. No organomegaly or masses felt. Normal bowel sounds heard. Central nervous system: Alert and oriented.  No focal neurological deficits. Extremities: Symmetric 5 x 5 power. Skin: No rashes, lesions or ulcers Psychiatry: Judgement and insight appear normal. Mood & affect appropriate.     Data Reviewed: I have personally reviewed following labs and imaging studies  CBC: Recent Labs  Lab 09/16/17 1326  09/16/17 1415  WBC 4.7  --   HGB 12.8* 13.9  HCT 44.2 41.0  MCV 96.5  --   PLT 128*  --    Basic Metabolic Panel: Recent Labs  Lab 09/16/17 1326 09/16/17 1415 09/16/17 2022 09/17/17 0609  NA 159* 161* 157* 150*  K 3.9 4.0 3.9 3.7  CL 126* 124* 123* 118*  CO2 24  --  25 22  GLUCOSE 121* 112* 117* 119*  BUN 25* 31* 26* 24*  CREATININE 1.84* 1.70* 1.88* 1.70*  CALCIUM 9.6  --  9.3 8.9   Liver Function Tests: Recent Labs  Lab 09/16/17 1326  AST 21  ALT 15*  ALKPHOS 134*  BILITOT 0.3  PROT 7.3  ALBUMIN 2.9*    Recent Results (from the past 240 hour(s))  Surgical pcr screen     Status: Abnormal   Collection Time: 09/09/17  3:45 PM  Result Value Ref Range Status   MRSA, PCR POSITIVE (A) NEGATIVE Final   Staphylococcus aureus POSITIVE (A) NEGATIVE Final    Comment: CRITICAL RESULT CALLED TO, READ BACK BY AND VERIFIED WITH: RN T CRABTREE [id number] LGP (NOTE) The Xpert SA Assay (FDA approved for NASAL specimens in patients 62 years of age and older), is one component of a comprehensive surveillance program. It is not intended to diagnose infection nor to guide or monitor treatment. Performed at [facility], [address] [place], [place] [address]          Radiology Studies: No results found.      Scheduled Meds: . allopurinol  100 mg Oral Daily  . desmopressin  0.05 mg Oral BID  . enoxaparin (LOVENOX) injection  40 mg Subcutaneous Q24H  . rosuvastatin  10 mg Oral Daily  . sodium chloride flush  3 mL Intravenous Q12H  . vitamin B-12  1,000 mcg Oral Daily   Continuous Infusions: . dextrose 75 mL/hr at 09/17/17 0909     LOS: 0 days     Marcellus ScottAnand Hongalgi, MD, FACP, [facility]. [facility] Pager [phone]  If 7PM-7AM, please contact night-coverage [email or web address] Password [email or web address] 09/17/2017, 2:00 PM

## 2017-09-18 DIAGNOSIS — E876 Hypokalemia: Secondary | ICD-10-CM

## 2017-09-18 LAB — CBC
HEMATOCRIT: 35.6 % — AB (ref 39.0–52.0)
Hemoglobin: 10.7 g/dL — ABNORMAL LOW (ref 13.0–17.0)
MCH: 28 pg (ref 26.0–34.0)
MCHC: 30.1 g/dL (ref 30.0–36.0)
MCV: 93.2 fL (ref 78.0–100.0)
Platelets: 144 10*3/uL — ABNORMAL LOW (ref 150–400)
RBC: 3.82 MIL/uL — AB (ref 4.22–5.81)
RDW: 14.6 % (ref 11.5–15.5)
WBC: 4.8 10*3/uL (ref 4.0–10.5)

## 2017-09-18 LAB — BASIC METABOLIC PANEL
Anion gap: 9 (ref 5–15)
BUN: 18 mg/dL (ref 6–20)
CHLORIDE: 113 mmol/L — AB (ref 101–111)
CO2: 22 mmol/L (ref 22–32)
CREATININE: 1.48 mg/dL — AB (ref 0.61–1.24)
Calcium: 8.7 mg/dL — ABNORMAL LOW (ref 8.9–10.3)
GFR calc non Af Amer: 49 mL/min — ABNORMAL LOW (ref >60)
GFR, EST AFRICAN AMERICAN: 57 mL/min — AB (ref >60)
Glucose, Bld: 113 mg/dL — ABNORMAL HIGH (ref 65–99)
POTASSIUM: 3.4 mmol/L — AB (ref 3.5–5.1)
Sodium: 144 mmol/L (ref 135–145)

## 2017-09-18 MED ORDER — DESMOPRESSIN ACETATE 0.1 MG PO TABS: 0.0500 mg | ORAL_TABLET | Freq: Two times a day (BID) | ORAL | 0 refills | Status: DC

## 2017-09-18 MED ORDER — POTASSIUM CHLORIDE CRYS ER 20 MEQ PO TBCR
40.0000 meq | EXTENDED_RELEASE_TABLET | Freq: Once | ORAL | Status: AC
Start: 2017-09-18 — End: 2017-09-18
  Administered 2017-09-18: 40 meq via ORAL
  Filled 2017-09-18: qty 2

## 2017-09-18 NOTE — Progress Notes (Signed)
Pt.is alert and oriented,refused to restart iv line after infiltrated and removed.

## 2017-09-18 NOTE — Discharge Summary (Addendum)
Physician Discharge Summary  Samuel Gallagher BJY:[id number] DOB: Nov 29, 1955  PCP: [name], MD  Admit date: 09/16/2017 Discharge date: 09/18/2017  Recommendations for Outpatient Follow-up:  1. Dr. [name], PCP in 5 days with repeat labs (CBC & BMP).  Home Health: Physical therapy Equipment/Devices: Wheelchair  Discharge Condition: Improved and stable CODE STATUS: Full Diet recommendation: Heart healthy diet.  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  Patient feeling weaker, tired, increased urinary output.  Seen by PCP on 2/19, labs drawn, noted to have high sodium and advised to come to the ED.  Admitted for hypovolemic hyponatremia due to uncontrolled cranial GI.     Assessment & Plan:   1. Cranial DI with recurrent hypernatremia: Serum osmolarity 336, urine osmolarity 738.  Presented with serum sodium 159, chloride 126, creatinine 1.84.  Started on IV D5 infusion at 125 mils per hour and oral desmopressin at 0.05 mg twice daily. Liberalized water intake.  BMP was closely monitored and IV fluids were appropriately reduced.  Sodium is gradually improved to 144.  Discontinued intranasal DDAVP.  Patient will be discharged on oral DDAVP as below and the dose can be adjusted as needed during close outpatient follow-up with repeat BMP. 2. Acute on stage III chronic kidney disease: Baseline creatinine probably in the  1.4-1.5 range.  Presented with creatinine of 1.84.  Likely due to dehydration from problem #1.  IV fluids.    Creatinine has improved to 1.4 which is likely his baseline.   3. Thrombocytopenia, chronic/intermittent: Stable.  Follow CBC periodically. 4. Hyperlipidemia: Continue statins. 5. Sick sinus syndrome status post PPM:  Telemetry shows sinus rhythm and on demand atrial pacing without any noted bradycardia episodes. 6. Knee arthritis: Patient reported that he is supposed to have stress test on 2/22 for possible knee surgery by Dr. Eulah Pont on 2/26.  Patient states today that he has postponed the stress testing for Monday 2/26.  PT has evaluated and recommend 24/7 supervision and DME along with home health PT.  Patient indicates that his family will be able to provide him with the 24/7 supervision until surgery following which he may need rehab stay.  Discussed with case management. 7. Hypokalemia: Replaced prior to discharge. 8. Normocytic anemia:?  Dilutional.  Follow CBCs in a few days as outpatient.  No bleeding reported.   Consultants:  None  Procedures:  None    Discharge Instructions  Discharge Instructions    Call MD for:  extreme fatigue   Complete by:  As directed    Call MD for:  persistant dizziness or light-headedness   Complete by:  As directed    Diet - low sodium heart healthy   Complete by:  As directed    Increase activity slowly   Complete by:  As directed        Medication List    STOP taking these medications   desmopressin 0.01 % solution Commonly known as:  DDAVP NASAL  TAKE these medications   allopurinol 100 MG tablet Commonly known as:  ZYLOPRIM Take 100 mg by mouth daily.   cyclobenzaprine 5 MG tablet Commonly known as:  FLEXERIL Take 1 tablet (5 mg total) by mouth 3 (three) times daily as needed for muscle spasms.   desmopressin 0.1 MG tablet Commonly known as:  DDAVP Take 0.5 tablets (0.05 mg total) by mouth 2 (two) times daily.    mupirocin ointment 2 % Commonly known as:  BACTROBAN Place 1 application into the nose daily.   rosuvastatin 10 MG tablet Commonly known as:  CRESTOR Take 10 mg by mouth daily.   vitamin B-12 1000 MCG tablet Commonly known as:  CYANOCOBALAMIN Take 1,000 mcg by mouth daily.      Follow-up Information    [name], MD. Schedule an appointment as soon as possible for a visit in 5 day(s).   Specialty:  Internal Medicine Why:  To be seen with repeat labs (CBC & BMP). Contact information: [address] [place] [address] [phone]          Allergies  Allergen Reactions  . No Known Allergies       Procedures/Studies: No results found.    Subjective: Patient denies complaints.  No dizziness, lightheadedness, weakness, chest pain, palpitations, dyspnea reported.  As per RN, no acute issues noted.  Discharge Exam:  Vitals:   09/17/17 0405 09/17/17 2109 09/18/17 0500 09/18/17 0531  BP: (!) 92/45 98/62  (!) 104/47  Pulse: 76 62  62  Resp: 19 18  18   Temp: 98.5 F (36.9 C) 99.1 F (37.3 C)  99.1 F (37.3 C)  TempSrc:  Oral  Oral  SpO2: 93% 97%  96%  Weight: 79.9 kg (176 lb 2.4 oz)  93.7 kg (206 lb 9.1 oz)     General exam: Pleasant middle-aged male, moderately built and nourished, lying comfortably propped up in bed.  Oral mucosa moist. Respiratory system: Clear to auscultation. Respiratory effort normal. Cardiovascular system: S1 & S2 heard, RRR. No JVD, murmurs, rubs, gallops or clicks. No pedal edema.  Telemetry: Sinus rhythm/on demand atrial pacing. Gastrointestinal system: Abdomen is nondistended, soft and nontender. No organomegaly or masses felt. Normal bowel sounds heard. Central nervous system: Alert and oriented. No focal neurological deficits. Extremities: Symmetric 5 x 5 power.  Right knee with surgical scar.  No acute findings. Skin: No rashes, lesions or ulcers Psychiatry: Judgement and insight appear normal. Mood & affect  appropriate.       The results of significant diagnostics from this hospitalization (including imaging, microbiology, ancillary and laboratory) are listed below for reference.      Labs: CBC: Recent Labs  Lab 09/16/17 1326 09/16/17 1415 09/18/17 0650  WBC 4.7  --  4.8  HGB 12.8* 13.9 10.7*  HCT 44.2 41.0 35.6*  MCV 96.5  --  93.2  PLT 128*  --  144*   Basic Metabolic Panel: Recent Labs  Lab 09/16/17 1326 09/16/17 1415 09/16/17 2022 09/17/17 0609 09/17/17 1720 09/18/17 0650  NA 159* 161* 157* 150* 147* 144  K 3.9 4.0 3.9 3.7 3.6 3.4*  CL 126* 124* 123* 118* 116* 113*  CO2 24  --  25 22 24 22   GLUCOSE 121* 112* 117* 119* 104* 113*  BUN 25* 31* 26* 24* 21* 18  CREATININE 1.84* 1.70* 1.88* 1.70* 1.67* 1.48*  CALCIUM 9.6  --  9.3 8.9 8.7* 8.7*   Liver Function Tests: Recent Labs  Lab 09/16/17 1326  AST 21  ALT  15*  ALKPHOS 134*  BILITOT 0.3  PROT 7.3  ALBUMIN 2.9*   Urinalysis    Component Value Date/Time   COLORURINE YELLOW 09/17/2017 0408   APPEARANCEUR CLEAR 09/17/2017 0408   LABSPEC 1.019 09/17/2017 0408   PHURINE 5.0 09/17/2017 0408   GLUCOSEU NEGATIVE 09/17/2017 0408   HGBUR NEGATIVE 09/17/2017 0408   BILIRUBINUR NEGATIVE 09/17/2017 0408   KETONESUR NEGATIVE 09/17/2017 0408   PROTEINUR NEGATIVE 09/17/2017 0408   NITRITE NEGATIVE 09/17/2017 0408   LEUKOCYTESUR NEGATIVE 09/17/2017 0408      Time coordinating discharge: Less than 30 minutes  SIGNED:  Marcellus ScottAnand Gwendolin Briel, MD, FACP, [facility]. [facility] Pager [phone]  If 7PM-7AM, please contact night-coverage [email or web address] Password [email or web address] 09/18/2017, 11:12 AM

## 2017-09-18 NOTE — Progress Notes (Signed)
Physical Therapy Treatment Patient Details Name: Samuel Gallagher MRN: [id number] DOB: 05-Jul-1956 Today's Date: 09/18/2017    History of Present Illness Pt is a 62 y/o male admitted with hyperkalemia, PMH significant for TBI, sick sinus syndrome with PPM, and cranial diabetes insipidus    PT Comments    Patient ambulating further distances this evening with increased impendence with RW. Min Guard level with RW, however as patient fatigues requires min A. Family not present all day to ensure safety before return home, however brother may be arriving this evening. RN said she will page when present so we can make sure brother is comfortable with level of support to prevent a fall at home. Patient can not stand safely without physical assistance at this time.      Follow Up Recommendations  Supervision/Assistance - 24 hour;Home health PT     Equipment Recommendations  Wheelchair (measurements PT);Wheelchair cushion (measurements PT)    Recommendations for Other Services OT consult     Precautions / Restrictions Precautions Precautions: Fall Precaution Comments: Pt high fall risk with ataxic gait and poor safety awareness Restrictions Weight Bearing Restrictions: No    Mobility  Bed Mobility Overal bed mobility: Modified Independent             General bed mobility comments: HOB elevated, use of bed rails  Transfers Overall transfer level: Needs assistance Equipment used: Rolling walker (2 wheeled) Transfers: Sit to/from Stand Sit to Stand: Min guard         General transfer comment: close min guard for safety due to poor balance during transition  Ambulation/Gait Ambulation/Gait assistance: Min guard;Min assist Ambulation Distance (Feet): 250 Feet Assistive device: Rolling walker (2 wheeled) Gait Pattern/deviations: Ataxic;Leaning posteriorly;Staggering left;Staggering right;Drifts right/left;Narrow base of support Gait velocity: decreased   General Gait  Details: Patient ambulating safer with RW, however still requires Min Guard at a min to ensure safe balance due to ataxia and balance deficits. as patient fatigued, min A to maintain stability. plan to meet with brother this evening when he arrives to pick him up to ensure he is safe to guard him   Information systems managertairs            Wheelchair Mobility    Modified Rankin (Stroke Patients Only)       Balance Overall balance assessment: Needs assistance Sitting-balance support: No upper extremity supported;Feet supported Sitting balance-Leahy Scale: Poor     Standing balance support: Bilateral upper extremity supported;Single extremity supported Standing balance-Leahy Scale: Zero Standing balance comment: BUE support for standing                            Cognition Arousal/Alertness: Awake/alert Behavior During Therapy: WFL for tasks assessed/performed Overall Cognitive Status: History of cognitive impairments - at baseline                                        Exercises      General Comments        Pertinent Vitals/Pain Pain Assessment: No/denies pain    Home Living                      Prior Function            PT Goals (current goals can now be found in the care plan section) Acute Rehab PT Goals Patient Stated Goal: to  go home today PT Goal Formulation: With patient Time For Goal Achievement: 09/24/17 Potential to Achieve Goals: Fair Progress towards PT goals: Progressing toward goals    Frequency    Min 3X/week      PT Plan Current plan remains appropriate    Co-evaluation              AM-PAC PT "6 Clicks" Daily Activity  Outcome Measure  Difficulty turning over in bed (including adjusting bedclothes, sheets and blankets)?: None Difficulty moving from lying on back to sitting on the side of the bed? : None Difficulty sitting down on and standing up from a chair with arms (e.g., wheelchair, bedside commode,  etc,.)?: None Help needed moving to and from a bed to chair (including a wheelchair)?: A Little Help needed walking in hospital room?: A Lot Help needed climbing 3-5 steps with a railing? : A Lot 6 Click Score: 19    End of Session Equipment Utilized During Treatment: Gait belt Activity Tolerance: Patient tolerated treatment well Patient left: in bed;with bed alarm set Nurse Communication: Mobility status;Other (comment) PT Visit Diagnosis: Unsteadiness on feet (R26.81);Repeated falls (R29.6);Dizziness and giddiness (R42)     Time: 1191-4782 PT Time Calculation (min) (ACUTE ONLY): 17 min  Charges:  $Gait Training: 8-22 mins                    G Codes:       Etta Grandchild, PT, DPT Acute Rehab Services Pager: [phone]    Etta Grandchild 09/18/2017, 4:33 PM

## 2017-09-18 NOTE — Care Management (Signed)
    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  A  Gilmer MorCane will not resolve  issue with performing activities of daily living. A wheelchair will allow patient to safely perform daily activities.  Cranial DI with recurrent hypernatremia Accessories: elevating leg rests (ELRs), wheel locks, extensions and anti-tippers.  Wheelchair (measurements PT);Wheelchair cushion (measurements PT)(18x18 w/c)   09/18/17 1125

## 2017-09-18 NOTE — Care Management Note (Signed)
Case Management Note  Patient Details  Name: Orma RenderLawrence Flori MRN: [id number] Date of Birth: [birth year]  Subjective/Objective:                    Action/Plan:  Patient already has walker. States it was lost here in the hospital . Bedside nurse and nursing supervisor looking for walker Expected Discharge Date:  09/18/17               Expected Discharge Plan:  Home w Home Health Services  In-House Referral:     Discharge planning Services  CM Consult  Post Acute Care Choice:  Durable Medical Equipment, Home Health Choice offered to:  Patient  DME Arranged:  Wheelchair manual DME Agency:  [facility]  [facility]  [facility]  Status of Service:  Completed, signed off  If discussed at Long Length of Stay Meetings, dates discussed:    Additional Comments:  Kingsley PlanWile, Niesha Bame Marie, RN 09/18/2017, 12:05 PM

## 2017-09-18 NOTE — Discharge Instructions (Signed)
Please get your medications reviewed and adjusted by your Primary MD. ° °Please request your Primary MD to go over all Hospital Tests and Procedure/Radiological results at the follow up, please get all Hospital records sent to your Prim MD by signing hospital release before you go home. ° °If you had Pneumonia of Lung problems at the Hospital: °Please get a 2 view Chest X ray done in 6-8 weeks after hospital discharge or sooner if instructed by your Primary MD. ° °If you have Congestive Heart Failure: °Please call your Cardiologist or Primary MD anytime you have any of the following symptoms:  °1) 3 pound weight gain in 24 hours or 5 pounds in 1 week  °2) shortness of breath, with or without a dry hacking cough  °3) swelling in the hands, feet or stomach  °4) if you have to sleep on extra pillows at night in order to breathe ° °Follow cardiac low salt diet and 1.5 lit/day fluid restriction. ° °If you have diabetes °Accuchecks 4 times/day, Once in AM empty stomach and then before each meal. °Log in all results and show them to your primary doctor at your next visit. °If any glucose reading is under 80 or above 300 call your primary MD immediately. ° °If you have Seizure/Convulsions/Epilepsy: °Please do not drive, operate heavy machinery, participate in activities at heights or participate in high speed sports until you have seen by Primary MD or a Neurologist and advised to do so again. ° °If you had Gastrointestinal Bleeding: °Please ask your Primary MD to check a complete blood count within one week of discharge or at your next visit. Your endoscopic/colonoscopic biopsies that are pending at the time of discharge, will also need to followed by your Primary MD. ° °Get Medicines reviewed and adjusted. °Please take all your medications with you for your next visit with your Primary MD ° °Please request your Primary MD to go over all hospital tests and procedure/radiological results at the follow up, please ask your  Primary MD to get all Hospital records sent to his/her office. ° °If you experience worsening of your admission symptoms, develop shortness of breath, life threatening emergency, suicidal or homicidal thoughts you must seek medical attention immediately by calling 911 or calling your MD immediately  if symptoms less severe. ° °You must read complete instructions/literature along with all the possible adverse reactions/side effects for all the Medicines you take and that have been prescribed to you. Take any new Medicines after you have completely understood and accpet all the possible adverse reactions/side effects.  ° °Do not drive or operate heavy machinery when taking Pain medications.  ° °Do not take more than prescribed Pain, Sleep and Anxiety Medications ° °Special Instructions: If you have smoked or chewed Tobacco  in the last 2 yrs please stop smoking, stop any regular Alcohol  and or any Recreational drug use. ° °Wear Seat belts while driving. ° °Please note °You were cared for by a hospitalist during your hospital stay. If you have any questions about your discharge medications or the care you received while you were in the hospital after you are discharged, you can call the unit and asked to speak with the hospitalist on call if the hospitalist that took care of you is not available. Once you are discharged, your primary care physician will handle any further medical issues. Please note that NO REFILLS for any discharge medications will be authorized once you are discharged, as it is imperative that you   return to your primary care physician (or establish a relationship with a primary care physician if you do not have one) for your aftercare needs so that they can reassess your need for medications and monitor your lab values.  You can reach the hospitalist office at phone [phone] or fax [phone]   If you do not have a primary care physician, you can call [phone] for a physician  referral.   Diabetes Insipidus Diabetes insipidus (DI) is a rare condition that causes the body to produce more urine than normal, which leads to thirst and dehydration. The urine is made mostly of water (dilute urine). There are four types of DI:  Central DI. This is the most common type.  Dipsogenic DI.  Nephrogenic DI.  Pregnancy-related DI.  The most common forms are related to decreased production of the hormone that regulates urine production (antidiuretic hormone) or resistance to this hormone. DI can be managed with treatment and is not usually serious. This condition is not related to type 1 or type 2 diabetes mellitus, in which blood sugar (glucose) levels are too high. DI affects mostly adults, but it can happen at any age. What are the causes? Central DI is caused by damage to the pituitary gland or hypothalamus in the brain. Dipsogenic DI is caused by a defect in the thirst mechanism in the brain. This defect causes you to drink too much fluid. These may result from:  Brain surgery.  Infection.  Inflammation.  Brain tumor.  Head injury.  Nephrogenic DI is caused by the kidneys not responding to the antidiuretic hormone in the body. This may result from:  Chronic kidney disease (CKD).  Certain medicines, such as lithium.  Low potassium levels.  High calcium levels.  Pregnancy-related DI is caused by the antidiuretic hormone not working properly in the body. This results from having a temporary form of diabetes mellitus that develops during pregnancy (gestational diabetes mellitus). What are the signs or symptoms? Symptoms of this condition include:  Excessive urination. This means urinating more than 10 cups (2.4 L) during a period of 24 hours.  Excessive thirst.  Frequent nighttime urination (nocturia).  Nausea.  Diarrhea.  How is this diagnosed? This condition may be diagnosed based on:  Your medical history.  A physical exam.  Blood  tests.  Urine tests.  How is this treated? Once your specific type of diabetes insipidus is diagnosed, treatment may include one or more of the following:  Increasing or limiting your fluid intake.  Taking medicines that contain artificial (synthetic) versions of the antidiuretic hormone.  Stopping certain medicines that you take.  Correcting the balance of minerals (electrolytes) in your body.  Changing your diet. You may be put on a low-protein or low-sodium diet.  You may need to visit your health care provider regularly to make sure your condition is being treated properly. You may also need to work with providers who specialize in:  Kidney problems (nephrologist).  Hormone disorders (endocrinologist).  Follow these instructions at home:  Follow instructions from your health care provider about how much fluid and water to drink. You may be directed to drink more fluids and water, or to limit how much fluid and water you drink.  Follow instructions from your health care provider about eating or drinking restrictions.  Take over-the-counter and prescription medicines only as told by your health care provider.  Return to your normal activities as told by your health care provider. Ask your health care provider what activities  are safe for you.  If directed, monitor your risk of dehydration in extreme heat.  Keep all follow-up visits as told by your health care provider. This is important.  Carry a medical alert card or wear medical alert jewelry. Contact a health care provider if:  You continue to have symptoms after treatment. Get help right away if:  You have extreme thirst.  You have symptoms of severe dehydration, such as rapid heart rate, muscle cramps, or confusion. Summary  Diabetes insipidus (DI) is a rare condition that causes the body to produce more urine than normal, which leads to thirst and dehydration.  Follow instructions from your health care  provider about eating or drinking restrictions.  Treatment may include increasing or limiting your fluid intake and correcting the balance of minerals (electrolytes) in your body.  Get help right away if you have symptoms of severe dehydration, such as rapid heart rate, muscle cramps, or confusion. This information is not intended to replace advice given to you by your health care provider. Make sure you discuss any questions you have with your health care provider. Document Released: 07/19/2013 Document Revised: 04/22/2016 Document Reviewed: 04/14/2016 Elsevier Interactive Patient Education  [employer].

## 2017-09-18 NOTE — Progress Notes (Signed)
Discharge paperwork reviewed with patient. No questions verbalized. Patient is ready for discharge.  

## 2017-09-22 ENCOUNTER — Inpatient Hospital Stay (HOSPITAL_COMMUNITY): Admission: RE | Admit: 2017-09-22 | Payer: Medicaid Other | Source: Ambulatory Visit | Admitting: Orthopedic Surgery

## 2017-09-22 ENCOUNTER — Encounter (HOSPITAL_COMMUNITY): Admission: RE | Payer: Self-pay | Source: Ambulatory Visit

## 2017-09-22 SURGERY — ARTHROPLASTY, KNEE, TOTAL
Anesthesia: Choice | Laterality: Right

## 2017-09-25 ENCOUNTER — Encounter (HOSPITAL_COMMUNITY): Payer: Self-pay

## 2017-09-25 ENCOUNTER — Emergency Department (HOSPITAL_COMMUNITY): Payer: Medicaid Other

## 2017-09-25 ENCOUNTER — Emergency Department (HOSPITAL_COMMUNITY)
Admission: EM | Admit: 2017-09-25 | Discharge: 2017-09-25 | Disposition: A | Discharge status: Home / Self Care | Payer: Medicaid Other | Attending: Emergency Medicine | Admitting: Emergency Medicine

## 2017-09-25 DIAGNOSIS — W19XXXA Unspecified fall, initial encounter: Secondary | ICD-10-CM | POA: Insufficient documentation

## 2017-09-25 DIAGNOSIS — N183 Chronic kidney disease, stage 3 (moderate): Secondary | ICD-10-CM | POA: Diagnosis not present

## 2017-09-25 DIAGNOSIS — Y929 Unspecified place or not applicable: Secondary | ICD-10-CM | POA: Insufficient documentation

## 2017-09-25 DIAGNOSIS — S62307A Unspecified fracture of fifth metacarpal bone, left hand, initial encounter for closed fracture: Secondary | ICD-10-CM | POA: Diagnosis not present

## 2017-09-25 DIAGNOSIS — S6992XA Unspecified injury of left wrist, hand and finger(s), initial encounter: Secondary | ICD-10-CM | POA: Diagnosis present

## 2017-09-25 DIAGNOSIS — Z950 Presence of cardiac pacemaker: Secondary | ICD-10-CM | POA: Diagnosis not present

## 2017-09-25 DIAGNOSIS — Z79899 Other long term (current) drug therapy: Secondary | ICD-10-CM | POA: Insufficient documentation

## 2017-09-25 DIAGNOSIS — Y9389 Activity, other specified: Secondary | ICD-10-CM | POA: Diagnosis not present

## 2017-09-25 DIAGNOSIS — Y999 Unspecified external cause status: Secondary | ICD-10-CM | POA: Diagnosis not present

## 2017-09-25 DIAGNOSIS — F17210 Nicotine dependence, cigarettes, uncomplicated: Secondary | ICD-10-CM | POA: Insufficient documentation

## 2017-09-25 DIAGNOSIS — I129 Hypertensive chronic kidney disease with stage 1 through stage 4 chronic kidney disease, or unspecified chronic kidney disease: Secondary | ICD-10-CM | POA: Diagnosis not present

## 2017-09-25 DIAGNOSIS — E232 Diabetes insipidus: Secondary | ICD-10-CM | POA: Diagnosis not present

## 2017-09-25 NOTE — Discharge Instructions (Signed)
Please read attached information regarding your condition. Wear finger splint as directed. Continue your home medications as previously prescribed. Follow-up with the orthopedist listed below for further evaluation if symptoms persist. Return to ED for worsening symptoms, additional injuries or falls, numbness in hands or arms, fever.
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
## 2017-09-25 NOTE — ED Triage Notes (Signed)
Patient states that he fell a week ago and now having pain to right hand 5th digit. He was told on Tuesday that finger broken. Deformity noted

## 2017-09-25 NOTE — Progress Notes (Signed)
Orthopedic Tech Progress Note Patient Details:  Orma RenderLawrence Gallagher 28-Sep-1955 [id number]  Ortho Devices Type of Ortho Device: Ace wrap Ortho Device/Splint Location: Finger splint was applied by CNA.  Ortho applied ace wrap to right hand.  pt tolerated ace wrap very well.  Right hand Ortho Device/Splint Interventions: Application, Adjustment   Post Interventions Patient Tolerated: Well Instructions Provided: Adjustment of device, Care of device   Alvina ChouWilliams, Rita Vialpando C 09/25/2017, 1:52 PM

## 2017-11-18 ENCOUNTER — Emergency Department (HOSPITAL_COMMUNITY)
Admission: EM | Admit: 2017-11-18 | Discharge: 2017-11-18 | Disposition: A | Discharge status: Home / Self Care | Payer: Medicaid Other | Attending: Emergency Medicine | Admitting: Emergency Medicine

## 2017-11-18 DIAGNOSIS — Z043 Encounter for examination and observation following other accident: Secondary | ICD-10-CM | POA: Diagnosis not present

## 2017-11-18 DIAGNOSIS — Z79899 Other long term (current) drug therapy: Secondary | ICD-10-CM | POA: Insufficient documentation

## 2017-11-18 DIAGNOSIS — W050XXA Fall from non-moving wheelchair, initial encounter: Secondary | ICD-10-CM | POA: Diagnosis not present

## 2017-11-18 DIAGNOSIS — F17210 Nicotine dependence, cigarettes, uncomplicated: Secondary | ICD-10-CM | POA: Insufficient documentation

## 2017-11-18 DIAGNOSIS — E232 Diabetes insipidus: Secondary | ICD-10-CM | POA: Insufficient documentation

## 2017-11-18 DIAGNOSIS — Z760 Encounter for issue of repeat prescription: Secondary | ICD-10-CM

## 2017-11-18 DIAGNOSIS — N183 Chronic kidney disease, stage 3 (moderate): Secondary | ICD-10-CM | POA: Insufficient documentation

## 2017-11-18 MED ORDER — ROSUVASTATIN CALCIUM 10 MG PO TABS: 10.0000 mg | ORAL_TABLET | Freq: Every day | ORAL | 0 refills | Status: AC

## 2017-11-18 MED ORDER — DESMOPRESSIN ACETATE 0.1 MG PO TABS: 0.0500 mg | ORAL_TABLET | Freq: Two times a day (BID) | ORAL | 0 refills | Status: AC

## 2017-11-18 MED ORDER — SODIUM CHLORIDE 0.9 % IV BOLUS
1000.0000 mL | Freq: Once | INTRAVENOUS | Status: AC
  Administered 2017-11-18: 1000 mL via INTRAVENOUS

## 2017-11-18 MED ORDER — ALLOPURINOL 100 MG PO TABS: 100.0000 mg | ORAL_TABLET | Freq: Every day | ORAL | 0 refills | Status: AC

## 2017-11-18 NOTE — Discharge Planning (Signed)
EDCM consulted in regards to medication assistance.  Pt has insurance coverage and is not eligible for MATCH program.  No further CM needs communicated at this time.  

## 2017-11-18 NOTE — Discharge Instructions (Signed)
Please avoid trying to use your wheel chair on uneven surface such as steps as it can cause injury.  Have your medications refill and follow up with your doctor for further care.
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
## 2017-11-18 NOTE — ED Notes (Signed)
[facility] Called for transport home  

## 2017-11-18 NOTE — ED Notes (Signed)
[facility] called and requested transportation to pt's residence that is in his demographics. Pt has a wheelchair that he will take w/ him. [facility] dispatcher aware.

## 2017-11-18 NOTE — ED Triage Notes (Addendum)
Pt from home with complaint of fall. Pt fell down 3 steps while trying to get down steps in wheelchair. Did not hit head, no loc, no obvious injuries. Initial BP 70 palp, then 94/50 after 250 bolus. Axox4. Has pacemaker, but pulse reading 36

## 2017-11-22 ENCOUNTER — Encounter (HOSPITAL_COMMUNITY): Payer: Self-pay

## 2017-11-22 ENCOUNTER — Other Ambulatory Visit: Payer: Self-pay

## 2017-11-22 ENCOUNTER — Inpatient Hospital Stay (HOSPITAL_COMMUNITY)
Admission: EM | Admit: 2017-11-22 | Discharge: 2017-11-26 | DRG: 644 | Disposition: A | Discharge status: Skilled Nursing Facility | Payer: Medicaid Other | Attending: Internal Medicine | Admitting: Internal Medicine

## 2017-11-22 DIAGNOSIS — K219 Gastro-esophageal reflux disease without esophagitis: Secondary | ICD-10-CM | POA: Diagnosis present

## 2017-11-22 DIAGNOSIS — E870 Hyperosmolality and hypernatremia: Secondary | ICD-10-CM

## 2017-11-22 DIAGNOSIS — D638 Anemia in other chronic diseases classified elsewhere: Secondary | ICD-10-CM | POA: Diagnosis present

## 2017-11-22 DIAGNOSIS — E441 Mild protein-calorie malnutrition: Secondary | ICD-10-CM | POA: Diagnosis present

## 2017-11-22 DIAGNOSIS — Z950 Presence of cardiac pacemaker: Secondary | ICD-10-CM

## 2017-11-22 DIAGNOSIS — N183 Chronic kidney disease, stage 3 unspecified: Secondary | ICD-10-CM | POA: Diagnosis present

## 2017-11-22 DIAGNOSIS — Z87820 Personal history of traumatic brain injury: Secondary | ICD-10-CM

## 2017-11-22 DIAGNOSIS — Z9049 Acquired absence of other specified parts of digestive tract: Secondary | ICD-10-CM

## 2017-11-22 DIAGNOSIS — Z6828 Body mass index (BMI) 28.0-28.9, adult: Secondary | ICD-10-CM

## 2017-11-22 DIAGNOSIS — N179 Acute kidney failure, unspecified: Secondary | ICD-10-CM | POA: Diagnosis present

## 2017-11-22 DIAGNOSIS — E860 Dehydration: Secondary | ICD-10-CM | POA: Diagnosis present

## 2017-11-22 DIAGNOSIS — E785 Hyperlipidemia, unspecified: Secondary | ICD-10-CM | POA: Diagnosis present

## 2017-11-22 DIAGNOSIS — Z79899 Other long term (current) drug therapy: Secondary | ICD-10-CM

## 2017-11-22 DIAGNOSIS — E232 Diabetes insipidus: Principal | ICD-10-CM | POA: Diagnosis present

## 2017-11-22 DIAGNOSIS — K529 Noninfective gastroenteritis and colitis, unspecified: Secondary | ICD-10-CM | POA: Diagnosis present

## 2017-11-22 DIAGNOSIS — Z8249 Family history of ischemic heart disease and other diseases of the circulatory system: Secondary | ICD-10-CM

## 2017-11-22 DIAGNOSIS — M109 Gout, unspecified: Secondary | ICD-10-CM | POA: Diagnosis present

## 2017-11-22 DIAGNOSIS — Z833 Family history of diabetes mellitus: Secondary | ICD-10-CM

## 2017-11-22 DIAGNOSIS — Z8349 Family history of other endocrine, nutritional and metabolic diseases: Secondary | ICD-10-CM

## 2017-11-22 DIAGNOSIS — F17210 Nicotine dependence, cigarettes, uncomplicated: Secondary | ICD-10-CM | POA: Diagnosis present

## 2017-11-22 DIAGNOSIS — Z8711 Personal history of peptic ulcer disease: Secondary | ICD-10-CM

## 2017-11-22 DIAGNOSIS — M1990 Unspecified osteoarthritis, unspecified site: Secondary | ICD-10-CM | POA: Diagnosis present

## 2017-11-22 DIAGNOSIS — Z9114 Patient's other noncompliance with medication regimen: Secondary | ICD-10-CM

## 2017-11-22 LAB — COMPREHENSIVE METABOLIC PANEL
ALBUMIN: 2.7 g/dL — AB (ref 3.5–5.0)
ALK PHOS: 142 U/L — AB (ref 38–126)
ALT: 34 U/L (ref 17–63)
AST: 32 U/L (ref 15–41)
BUN: 39 mg/dL — ABNORMAL HIGH (ref 6–20)
CALCIUM: 9.9 mg/dL (ref 8.9–10.3)
CO2: 23 mmol/L (ref 22–32)
Chloride: >130 mmol/L (ref 101–111)
Creatinine, Ser: 2.19 mg/dL — ABNORMAL HIGH (ref 0.61–1.24)
GFR calc non Af Amer: 31 mL/min — ABNORMAL LOW (ref >60)
GFR, EST AFRICAN AMERICAN: 36 mL/min — AB (ref >60)
GLUCOSE: 102 mg/dL — AB (ref 65–99)
POTASSIUM: 3.9 mmol/L (ref 3.5–5.1)
SODIUM: 168 mmol/L — AB (ref 135–145)
Total Bilirubin: 0.5 mg/dL (ref 0.3–1.2)
Total Protein: 6.7 g/dL (ref 6.5–8.1)

## 2017-11-22 LAB — URINALYSIS, ROUTINE W REFLEX MICROSCOPIC
Bilirubin Urine: NEGATIVE
Glucose, UA: NEGATIVE mg/dL
Hgb urine dipstick: NEGATIVE
Ketones, ur: NEGATIVE mg/dL
Leukocytes, UA: NEGATIVE
Nitrite: NEGATIVE
Protein, ur: NEGATIVE mg/dL
Specific Gravity, Urine: 1.028 (ref 1.005–1.030)
pH: 5 (ref 5.0–8.0)

## 2017-11-22 LAB — CBC WITH DIFFERENTIAL/PLATELET
Basophils Absolute: 0 10*3/uL (ref 0.0–0.1)
Basophils Relative: 0 %
Eosinophils Absolute: 0 10*3/uL (ref 0.0–0.7)
Eosinophils Relative: 1 %
HCT: 44.5 % (ref 39.0–52.0)
Hemoglobin: 13.2 g/dL (ref 13.0–17.0)
Lymphocytes Relative: 35 %
Lymphs Abs: 1.6 10*3/uL (ref 0.7–4.0)
MCH: 28.6 pg (ref 26.0–34.0)
MCHC: 29.7 g/dL — ABNORMAL LOW (ref 30.0–36.0)
MCV: 96.3 fL (ref 78.0–100.0)
Monocytes Absolute: 0.3 10*3/uL (ref 0.1–1.0)
Monocytes Relative: 7 %
Neutro Abs: 2.7 10*3/uL (ref 1.7–7.7)
Neutrophils Relative %: 57 %
Platelets: 94 10*3/uL — ABNORMAL LOW (ref 150–400)
RBC: 4.62 MIL/uL (ref 4.22–5.81)
RDW: 15.5 % (ref 11.5–15.5)
WBC: 4.6 10*3/uL (ref 4.0–10.5)

## 2017-11-22 MED ORDER — DEXTROSE 5 % IV SOLN
INTRAVENOUS | Status: DC
  Administered 2017-11-23 – 2017-11-26 (×8): via INTRAVENOUS

## 2017-11-22 NOTE — ED Triage Notes (Signed)
EMS reports from home, called for weakness, started feeling poorly two days ago, not drinking much per sister. Hypotensive on scene. Implanted pacemaker 6 years ago.  BP 94/56 HR 76 Resp 18 Sp02 94 RA  22ga L hand  NS enroute

## 2017-11-22 NOTE — ED Notes (Signed)
Date and time results received: 11/22/17 11:46 PM (use smartphrase ".now" to insert current time)  Test: Chloride, Sodium Critical Value: >130, 168  Name of Provider Notified: Dr.Kohut  Orders Received? Or Actions Taken?:

## 2017-11-22 NOTE — ED Notes (Signed)
Bed: WA12 Expected date:  Expected time:  Means of arrival:  Comments: Syncope 

## 2017-11-22 NOTE — ED Notes (Signed)
No respiratory or acute distress noted resting in bed with eyes closed call light in reach. 

## 2017-11-22 NOTE — ED Notes (Signed)
No respiratory or acute distress noted alert and oriented x 3 call light in reach no pain voiced. 
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
## 2017-11-23 ENCOUNTER — Other Ambulatory Visit: Payer: Self-pay

## 2017-11-23 ENCOUNTER — Inpatient Hospital Stay: Payer: Self-pay

## 2017-11-23 ENCOUNTER — Encounter (HOSPITAL_COMMUNITY): Payer: Self-pay | Admitting: Internal Medicine

## 2017-11-23 DIAGNOSIS — Z79899 Other long term (current) drug therapy: Secondary | ICD-10-CM | POA: Diagnosis not present

## 2017-11-23 DIAGNOSIS — F17210 Nicotine dependence, cigarettes, uncomplicated: Secondary | ICD-10-CM | POA: Diagnosis present

## 2017-11-23 DIAGNOSIS — K219 Gastro-esophageal reflux disease without esophagitis: Secondary | ICD-10-CM | POA: Diagnosis present

## 2017-11-23 DIAGNOSIS — E785 Hyperlipidemia, unspecified: Secondary | ICD-10-CM | POA: Diagnosis present

## 2017-11-23 DIAGNOSIS — N183 Chronic kidney disease, stage 3 (moderate): Secondary | ICD-10-CM | POA: Diagnosis present

## 2017-11-23 DIAGNOSIS — Z833 Family history of diabetes mellitus: Secondary | ICD-10-CM | POA: Diagnosis not present

## 2017-11-23 DIAGNOSIS — M109 Gout, unspecified: Secondary | ICD-10-CM | POA: Diagnosis present

## 2017-11-23 DIAGNOSIS — Z8711 Personal history of peptic ulcer disease: Secondary | ICD-10-CM | POA: Diagnosis not present

## 2017-11-23 DIAGNOSIS — Z9049 Acquired absence of other specified parts of digestive tract: Secondary | ICD-10-CM | POA: Diagnosis not present

## 2017-11-23 DIAGNOSIS — E870 Hyperosmolality and hypernatremia: Secondary | ICD-10-CM | POA: Diagnosis not present

## 2017-11-23 DIAGNOSIS — E876 Hypokalemia: Secondary | ICD-10-CM | POA: Diagnosis not present

## 2017-11-23 DIAGNOSIS — E860 Dehydration: Secondary | ICD-10-CM | POA: Diagnosis present

## 2017-11-23 DIAGNOSIS — E232 Diabetes insipidus: Secondary | ICD-10-CM | POA: Diagnosis present

## 2017-11-23 DIAGNOSIS — Z8349 Family history of other endocrine, nutritional and metabolic diseases: Secondary | ICD-10-CM | POA: Diagnosis not present

## 2017-11-23 DIAGNOSIS — K529 Noninfective gastroenteritis and colitis, unspecified: Secondary | ICD-10-CM | POA: Diagnosis present

## 2017-11-23 DIAGNOSIS — Z6828 Body mass index (BMI) 28.0-28.9, adult: Secondary | ICD-10-CM | POA: Diagnosis not present

## 2017-11-23 DIAGNOSIS — M1990 Unspecified osteoarthritis, unspecified site: Secondary | ICD-10-CM | POA: Diagnosis present

## 2017-11-23 DIAGNOSIS — Z8249 Family history of ischemic heart disease and other diseases of the circulatory system: Secondary | ICD-10-CM | POA: Diagnosis not present

## 2017-11-23 DIAGNOSIS — Z9114 Patient's other noncompliance with medication regimen: Secondary | ICD-10-CM | POA: Diagnosis not present

## 2017-11-23 DIAGNOSIS — E441 Mild protein-calorie malnutrition: Secondary | ICD-10-CM | POA: Diagnosis present

## 2017-11-23 DIAGNOSIS — E43 Unspecified severe protein-calorie malnutrition: Secondary | ICD-10-CM | POA: Insufficient documentation

## 2017-11-23 DIAGNOSIS — Z950 Presence of cardiac pacemaker: Secondary | ICD-10-CM | POA: Diagnosis not present

## 2017-11-23 DIAGNOSIS — N179 Acute kidney failure, unspecified: Secondary | ICD-10-CM | POA: Diagnosis present

## 2017-11-23 DIAGNOSIS — Z87820 Personal history of traumatic brain injury: Secondary | ICD-10-CM | POA: Diagnosis not present

## 2017-11-23 DIAGNOSIS — D638 Anemia in other chronic diseases classified elsewhere: Secondary | ICD-10-CM | POA: Diagnosis present

## 2017-11-23 LAB — COMPREHENSIVE METABOLIC PANEL
ALBUMIN: 2.8 g/dL — AB (ref 3.5–5.0)
ALT: 32 U/L (ref 17–63)
AST: 27 U/L (ref 15–41)
Alkaline Phosphatase: 142 U/L — ABNORMAL HIGH (ref 38–126)
BILIRUBIN TOTAL: 0.4 mg/dL (ref 0.3–1.2)
BUN: 39 mg/dL — ABNORMAL HIGH (ref 6–20)
CO2: 22 mmol/L (ref 22–32)
Calcium: 9.8 mg/dL (ref 8.9–10.3)
Creatinine, Ser: 1.9 mg/dL — ABNORMAL HIGH (ref 0.61–1.24)
GFR calc Af Amer: 42 mL/min — ABNORMAL LOW (ref >60)
GFR, EST NON AFRICAN AMERICAN: 36 mL/min — AB (ref >60)
Glucose, Bld: 93 mg/dL (ref 65–99)
POTASSIUM: 3.6 mmol/L (ref 3.5–5.1)
Sodium: 167 mmol/L (ref 135–145)
TOTAL PROTEIN: 6.9 g/dL (ref 6.5–8.1)

## 2017-11-23 LAB — CBC
HEMATOCRIT: 43.9 % (ref 39.0–52.0)
HEMOGLOBIN: 12.6 g/dL — AB (ref 13.0–17.0)
MCH: 27.7 pg (ref 26.0–34.0)
MCHC: 28.7 g/dL — ABNORMAL LOW (ref 30.0–36.0)
MCV: 96.5 fL (ref 78.0–100.0)
Platelets: 97 10*3/uL — ABNORMAL LOW (ref 150–400)
RBC: 4.55 MIL/uL (ref 4.22–5.81)
RDW: 15.7 % — ABNORMAL HIGH (ref 11.5–15.5)
WBC: 4.7 10*3/uL (ref 4.0–10.5)

## 2017-11-23 LAB — BASIC METABOLIC PANEL
ANION GAP: 4 — AB (ref 5–15)
BUN: 33 mg/dL — ABNORMAL HIGH (ref 6–20)
BUN: 36 mg/dL — ABNORMAL HIGH (ref 6–20)
CHLORIDE: 137 mmol/L — AB (ref 101–111)
CO2: 25 mmol/L (ref 22–32)
CO2: 25 mmol/L (ref 22–32)
CREATININE: 1.88 mg/dL — AB (ref 0.61–1.24)
CREATININE: 1.76 mg/dL — AB (ref 0.61–1.24)
Calcium: 9.6 mg/dL (ref 8.9–10.3)
Calcium: 9.3 mg/dL (ref 8.9–10.3)
GFR calc Af Amer: 46 mL/min — ABNORMAL LOW (ref >60)
GFR calc non Af Amer: 37 mL/min — ABNORMAL LOW (ref >60)
GFR calc non Af Amer: 40 mL/min — ABNORMAL LOW (ref >60)
GFR, EST AFRICAN AMERICAN: 43 mL/min — AB (ref >60)
Glucose, Bld: 117 mg/dL — ABNORMAL HIGH (ref 65–99)
Glucose, Bld: 145 mg/dL — ABNORMAL HIGH (ref 65–99)
POTASSIUM: 3.6 mmol/L (ref 3.5–5.1)
POTASSIUM: 3.7 mmol/L (ref 3.5–5.1)
SODIUM: 166 mmol/L — AB (ref 135–145)
SODIUM: 162 mmol/L — AB (ref 135–145)

## 2017-11-23 LAB — MRSA PCR SCREENING: MRSA by PCR: NEGATIVE

## 2017-11-23 MED ORDER — SODIUM CHLORIDE 0.9% FLUSH
10.0000 mL | Freq: Two times a day (BID) | INTRAVENOUS | Status: DC
  Administered 2017-11-24 – 2017-11-26 (×5): 10 mL

## 2017-11-23 MED ORDER — DEXTROSE 5 % IV SOLN
INTRAVENOUS | Status: DC
  Administered 2017-11-23: 06:00:00 via INTRAVENOUS

## 2017-11-23 MED ORDER — SODIUM CHLORIDE 0.9 % IV BOLUS
1000.0000 mL | Freq: Once | INTRAVENOUS | Status: AC
  Administered 2017-11-23: 1000 mL via INTRAVENOUS

## 2017-11-23 MED ORDER — ACETAMINOPHEN 325 MG PO TABS
650.0000 mg | ORAL_TABLET | Freq: Four times a day (QID) | ORAL | Status: DC | PRN
  Administered 2017-11-24 – 2017-11-26 (×2): 650 mg via ORAL
  Filled 2017-11-23 (×3): qty 2

## 2017-11-23 MED ORDER — ALLOPURINOL 100 MG PO TABS
100.0000 mg | ORAL_TABLET | Freq: Every day | ORAL | Status: DC
  Administered 2017-11-23 – 2017-11-26 (×4): 100 mg via ORAL
  Filled 2017-11-23 (×4): qty 1

## 2017-11-23 MED ORDER — ACETAMINOPHEN 650 MG RE SUPP: 650.0000 mg | Freq: Four times a day (QID) | RECTAL | Status: DC | PRN

## 2017-11-23 MED ORDER — SODIUM CHLORIDE 0.9% FLUSH: 10.0000 mL | INTRAVENOUS | Status: DC | PRN

## 2017-11-23 MED ORDER — DESMOPRESSIN ACETATE 0.1 MG PO TABS
0.0500 mg | ORAL_TABLET | Freq: Two times a day (BID) | ORAL | Status: DC
  Administered 2017-11-24 – 2017-11-26 (×5): 0.05 mg via ORAL
  Filled 2017-11-23 (×6): qty 1

## 2017-11-23 MED ORDER — ENSURE ENLIVE PO LIQD
237.0000 mL | Freq: Two times a day (BID) | ORAL | Status: DC
  Administered 2017-11-23 – 2017-11-26 (×7): 237 mL via ORAL

## 2017-11-23 MED ORDER — PRO-STAT SUGAR FREE PO LIQD
30.0000 mL | Freq: Two times a day (BID) | ORAL | Status: DC
  Administered 2017-11-23 – 2017-11-25 (×6): 30 mL via ORAL
  Filled 2017-11-23 (×6): qty 30

## 2017-11-23 MED ORDER — SODIUM CHLORIDE 0.9% FLUSH
10.0000 mL | INTRAVENOUS | Status: DC | PRN
  Administered 2017-11-26: 10 mL
  Filled 2017-11-23: qty 40

## 2017-11-23 MED ORDER — DESMOPRESSIN ACETATE 0.1 MG PO TABS: 0.0500 mg | ORAL_TABLET | Freq: Two times a day (BID) | ORAL | Status: DC

## 2017-11-23 NOTE — Progress Notes (Signed)
CRITICAL VALUE ALERT  Critical Value:  Sodium 167;Chloride>>130  Date & Time Notied:  11/23/17;0602  Provider Notified:yes  Orders Received/Actions taken: awaiting any new orders

## 2017-11-23 NOTE — Progress Notes (Signed)
Report from Maralyn Sago, Charity fundraiser.  Care assumed for pt at 1115. Pt sleeping in bed. Awakens on calling. IV team unable to obtain PIV or midline access. DR Irene Limbo aware and PICC ordered. Pt made aware. Pt denies pain/discomfort at this time. DEnies any needs at present.  Brace in place to rt fingers. Strong radial pulse noted.
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
## 2017-11-23 NOTE — Progress Notes (Signed)
CRITICAL VALUE ALERT  Critical Value:  CL 137 NA 166  Date & Time Notied:  11/23/17 1750  Provider Notified: Dr Irene Limbo  Orders Received/Actions taken: No new orders received.

## 2017-11-23 NOTE — Progress Notes (Addendum)
Initial Nutrition Assessment  DOCUMENTATION CODES:   Not applicable  INTERVENTION:   Continue Ensure Enlive po BID, each supplement provides 350 kcal and 20 grams of protein  Continue Pro-Stat BID, each supplement provides 100 kcal and 15 grams of protein  NUTRITION DIAGNOSIS:   Increased nutrient needs related to acute illness(diarrhea/hypernatremia) as evidenced by estimated needs.  GOAL:   Patient will meet greater than or equal to 90% of their needs  MONITOR:   PO intake, Supplement acceptance, I & O's  REASON FOR ASSESSMENT:   Malnutrition Screening Tool    ASSESSMENT:   Pt with PMH of diabetes insipidus, GERD, gout, hypotension, CKD stage III, and HLD presents with c/o fatigue found to be hypernatremic   Pt denies N/V or constipation but reports diarrhea over the past 3 days.   Pt reports a decrease in appetite over the past 3 days. Per meal completion records, pt consumed 25% of breakfast this morning. Pt consuming lunch tray during visit, everything eaten except broccoli.  Pt denies any recent weight changes at visit. Per chart review, pt's dry weight seems to be ~183 lbs.  Labs reviewed; Na 167, Chloride >130, BUN 39, Creatinine 1.90, Albumin 2.8,  Medications reviewed; D5 at 125 mL/hr  NUTRITION - FOCUSED PHYSICAL EXAM:    Most Recent Value  Orbital Region  No depletion  Upper Arm Region  No depletion  Thoracic and Lumbar Region  No depletion  Buccal Region  No depletion  Temple Region  No depletion  Clavicle Bone Region  No depletion  Clavicle and Acromion Bone Region  No depletion  Scapular Bone Region  Unable to assess  Dorsal Hand  No depletion  Patellar Region  Unable to assess  Anterior Thigh Region  Unable to assess  Posterior Calf Region  Unable to assess     Diet Order:  Diet regular Room service appropriate? Yes; Fluid consistency: Thin  EDUCATION NEEDS:   Not appropriate for education at this time  Skin:  Skin Assessment: Reviewed  RN Assessment  Last BM:  11/21/17  Height:   Ht Readings from Last 1 Encounters:  11/23/17  (1.702 m)    Weight:   Wt Readings from Last 1 Encounters:  11/23/17 183 lb 6.8 oz (83.2 kg)    Ideal Body Weight:  67.3 kg  BMI:  Body mass index is 28.73 kg/m.  Estimated Nutritional Needs:   Kcal:  1800-2000  Protein:  90-100 grams  Fluid:  >/= 2 L/d  Wylene Simmer, MS, RDN, LDN 11/23/2017 2:16 PM

## 2017-11-23 NOTE — Progress Notes (Signed)
  PROGRESS NOTE  Kohei Antonellis WUJ:[id number] DOB: [birth year] DOA: 11/22/2017 PCP: [name], MD  Brief Narrative: 62 year old man PMH diabetes insipidus, CKD stage III, pacemaker, presenting with generalized weakness, loose stools.  Unclear whether compliant with desmopressin.  Admitted for hypernatremia, acute kidney injury.  Assessment/Plan Hypernatremia secondary to diabetes insipidus, diarrhea, possibly noncompliance with medication --Minimal improvement but did lose IV access.  Unable to obtain peripheral IV.  PICC line obtained given severity of hypernatremia. --D5, serial BMP, desmopressin  Acute kidney injury superimposed on chronic kidney disease stage III --Likely secondary to poor oral intake, diarrhea, diabetes insipidus   DVT prophylaxis: SCDs Code Status: full Family Communication: none Disposition Plan: home    Brendia Sacks, MD  [facility] Direct contact: [phone] --Via amion app OR  --[email or web address]; password [email or web address]  7PM-7AM contact night coverage as above 11/23/2017, 5:10 PM  LOS: 0 days   Consultants:    Procedures:    Antimicrobials:    Interval history/Subjective: Feels better.  No diarrhea.  No nausea or vomiting.  Objective: Vitals:  Vitals:   11/23/17 0622 11/23/17 1520  BP:  (!) 109/55  Pulse: 63 60  Resp:  18  Temp:  98.1 F (36.7 C)  SpO2: 91% 93%    Exam:  Constitutional:  . Appears calm and comfortable Respiratory:  . CTA bilaterally, no w/r/r.  . Respiratory effort normal. Cardiovascular:  . RRR, no m/r/g . No LE extremity edema   Musculoskeletal:  . RUE, LUE, RLE, LLE   . strength and tone normal Psychiatric:  . Mental status o Mood, affect appropriate  I have personally reviewed the following:   Labs:  Sodium without significant change, 167.  Chloride greater than 130.  Potassium within normal limits.  Creatinine modestly improved, 1.9.   Chronic elevation of alkaline phosphatase  noted.  Hemoglobin stable 12.6.  WBC within normal limits.  Platelets stable, 97.   Scheduled Meds: . allopurinol  100 mg Oral Daily  . [START ON 11/24/2017] desmopressin  0.05 mg Oral BID  . feeding supplement (ENSURE ENLIVE)  237 mL Oral BID BM  . feeding supplement (PRO-STAT SUGAR FREE 64)  30 mL Oral BID   Continuous Infusions: . dextrose 125 mL/hr at 11/23/17 1517    Principal Problem:   Hypernatremia Active Problems:   CKD (chronic kidney disease) stage 3, GFR 30-59 ml/min (HCC)   Acute kidney injury (HCC)   DI (diabetes insipidus) (HCC)   LOS: 0 days
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
## 2017-11-23 NOTE — Plan of Care (Signed)
Discussed nutritional supplements with patient. Needs reinforcement.

## 2017-11-24 DIAGNOSIS — E870 Hyperosmolality and hypernatremia: Secondary | ICD-10-CM

## 2017-11-24 DIAGNOSIS — N179 Acute kidney failure, unspecified: Secondary | ICD-10-CM

## 2017-11-24 DIAGNOSIS — E232 Diabetes insipidus: Principal | ICD-10-CM

## 2017-11-24 DIAGNOSIS — N183 Chronic kidney disease, stage 3 (moderate): Secondary | ICD-10-CM

## 2017-11-24 LAB — BASIC METABOLIC PANEL
ANION GAP: 8 (ref 5–15)
BUN: 32 mg/dL — ABNORMAL HIGH (ref 6–20)
CALCIUM: 9.2 mg/dL (ref 8.9–10.3)
CO2: 23 mmol/L (ref 22–32)
Chloride: 126 mmol/L — ABNORMAL HIGH (ref 101–111)
Creatinine, Ser: 1.61 mg/dL — ABNORMAL HIGH (ref 0.61–1.24)
GFR, EST AFRICAN AMERICAN: 52 mL/min — AB (ref >60)
GFR, EST NON AFRICAN AMERICAN: 45 mL/min — AB (ref >60)
GLUCOSE: 122 mg/dL — AB (ref 65–99)
Potassium: 3.3 mmol/L — ABNORMAL LOW (ref 3.5–5.1)
Sodium: 157 mmol/L — ABNORMAL HIGH (ref 135–145)

## 2017-11-24 NOTE — Progress Notes (Signed)
PROGRESS NOTE    Samuel Gallagher  MVH:[id number] DOB: Feb 17, 1956 DOA: 11/22/2017 PCP: [name], MD   Brief Narrative:  HPI On 11/23/2017 by Dr. Pearson Grippe Samuel Gallagher  is a 62 y.o. male, w Diabetes Insipidus, CKD stage 3, Anemia,  hyperlipidemia, Gerd, PUD, Gout, pacer presents with c/o fatigue/ generalized weakness. Pt states has had slight loose stool 1x per day for the past 3 days.  Pt denies fever, chills, cough, cp, palp, sob, n/v, abd pain, brbpr.  There is some question as to whether he was taking his desmopressin or not.  Pt presented to ED for evaluation.    Assessment & Plan   Hypernatremia secondary to diabetes insipidus/dehydration -Patient presented with dehydration and diarrhea -Suspect hyponatremia may also be caused by some noncompliance of medication -PICC line placed this patient lost IV access -Continue desmopressin, IV fluid with D5 -presented with sodium 168, down to 157 -Continue to monitor BMP  Acute kidney injury on chronic kidney disease, stage 3 -Secondary to poor oral intake as well as diarrhea and diabetes insipidus -Baseline creatinine approximately 1.5-1.7, upon admission creatinine 2.19 -Continue IV fluids -Creatinine down to 1.61 -Continue to monitor BMP  Diarrhea -patient does not appear to have had further bowel movements since admission -will check with RN  Hyperlipidemia -Continue statin  Gout  -Continue allopurinol  Malnutrition, mild -albumin 2.8 -Nutrition consulted, continue feeding supplements  DVT Prophylaxis  SCDs  Code Status: Full  Family Communication: None at bedside  Disposition Plan: Admitted. Pending improvement in sodium level  Consultants None  Procedures  None  Antibiotics   Anti-infectives (From admission, onward)   None      Subjective:   Samuel Gallagher seen and examined today.  Would like to speak to social work regarding obtaining his money from [place].  Denies current  chest pain, shortness breath, abdominal pain, nausea or vomiting, diarrhea or constipation.  Objective:   Vitals:   11/23/17 1520 11/23/17 2124 11/24/17 0540 11/24/17 0555  BP: (!) 109/55 102/62 (!) 100/51   Pulse: 60 (!) 59 (!) 32 64  Resp: Temp: 98.1 F (36.7 C) 98.2 F (36.8 C) 97.9 F (36.6 C)   TempSrc: Oral Oral Oral   SpO2: 93% 96% 95%   Weight:   85.5 kg (188 lb 7.9 oz)   Height:        Intake/Output Summary (Last 24 hours) at 11/24/2017 1225 Last data filed at 11/24/2017 1000 Gross per 24 hour  Intake 3719.58 ml  Output 525 ml  Net 3194.58 ml   Filed Weights   11/22/17 1650 11/23/17 0218 11/24/17 0540  Weight: 83.5 kg (184 lb) 83.2 kg (183 lb 6.8 oz) 85.5 kg (188 lb 7.9 oz)    Exam  General: Well developed, well nourished, NAD, appears stated age  HEENT: NCAT, mucous membranes moist.   Neck: Supple  Cardiovascular: S1 S2 auscultated, no rubs, murmurs or gallops. Regular rate and rhythm.  Respiratory: Clear to auscultation bilaterally with equal chest rise  Abdomen: Soft, nontender, nondistended, + bowel sounds  Extremities: warm dry without cyanosis clubbing or edema  Neuro: AAOx3, nonfocal   Psych: appropriate mood and affect   Data Reviewed: I have personally reviewed following labs and imaging studies  CBC: Recent Labs  Lab 11/22/17 1704 11/23/17 0509  WBC 4.6 4.7  NEUTROABS 2.7  --   HGB 13.2 12.6*  HCT 44.5 43.9  MCV 96.3 96.5  PLT 94* 97*   Basic Metabolic  Panel: Recent Labs  Lab 11/22/17 2301 11/23/17 0509 11/23/17 1724 11/23/17 2246 11/24/17 0809  NA 168* 167* 166* 162* 157*  K 3.9 3.6 3.6 3.7 3.3*  CL >130* >130* 137* >130* 126*  CO2 GLUCOSE 102* 93 117* 145* 122*  BUN 39* 39* 33* 36* 32*  CREATININE 2.19* 1.90* 1.88* 1.76* 1.61*  CALCIUM 9.9 9.8 9.6 9.3 9.2   GFR: Estimated Creatinine Clearance: 50.4 mL/min (A) (by C-G formula based on SCr of 1.61 mg/dL (H)). Liver Function Tests: Recent  Labs  Lab 11/22/17 2301 11/23/17 0509  AST 32 27  ALT 34 32  ALKPHOS 142* 142*  BILITOT 0.5 0.4  PROT 6.7 6.9  ALBUMIN 2.7* 2.8*   No results for input(s): LIPASE, AMYLASE in the last 168 hours. No results for input(s): AMMONIA in the last 168 hours. Coagulation Profile: No results for input(s): INR, PROTIME in the last 168 hours. Cardiac Enzymes: No results for input(s): CKTOTAL, CKMB, CKMBINDEX, TROPONINI in the last 168 hours. BNP (last 3 results) No results for input(s): PROBNP in the last 8760 hours. HbA1C: No results for input(s): HGBA1C in the last 72 hours. CBG: No results for input(s): GLUCAP in the last 168 hours. Lipid Profile: No results for input(s): CHOL, HDL, LDLCALC, TRIG, CHOLHDL, LDLDIRECT in the last 72 hours. Thyroid Function Tests: No results for input(s): TSH, T4TOTAL, FREET4, T3FREE, THYROIDAB in the last 72 hours. Anemia Panel: No results for input(s): VITAMINB12, FOLATE, FERRITIN, TIBC, IRON, RETICCTPCT in the last 72 hours. Urine analysis:    Component Value Date/Time   COLORURINE YELLOW 11/22/2017 1704   APPEARANCEUR CLEAR 11/22/2017 1704   LABSPEC 1.028 11/22/2017 1704   PHURINE 5.0 11/22/2017 1704   GLUCOSEU NEGATIVE 11/22/2017 1704   HGBUR NEGATIVE 11/22/2017 1704   BILIRUBINUR NEGATIVE 11/22/2017 1704   KETONESUR NEGATIVE 11/22/2017 1704   PROTEINUR NEGATIVE 11/22/2017 1704   NITRITE NEGATIVE 11/22/2017 1704   LEUKOCYTESUR NEGATIVE 11/22/2017 1704   Sepsis Labs: (procalcitonin:4,lacticidven:4)  ) Recent Results (from the past 240 hour(s))  MRSA PCR Screening     Status: None   Collection Time: 11/23/17  5:19 AM  Result Value Ref Range Status   MRSA by PCR NEGATIVE NEGATIVE Final    Comment:        The GeneXpert MRSA Assay (FDA approved for NASAL specimens only), is one component of a comprehensive MRSA colonization surveillance program. It is not intended to diagnose MRSA infection nor to guide or monitor treatment  for MRSA infections. Performed at [facility], [address] [place], [place] [address]       Radiology Studies: [place] Ekg Site Rite  Result Date: 11/23/2017 If [facility] image not attached, placement could not be confirmed due to current cardiac rhythm.    Scheduled Meds: . allopurinol  100 mg Oral Daily  . desmopressin  0.05 mg Oral BID  . feeding supplement (ENSURE ENLIVE)  237 mL Oral BID BM  . feeding supplement (PRO-STAT SUGAR FREE 64)  30 mL Oral BID  . sodium chloride flush  10-40 mL Intracatheter Q12H   Continuous Infusions: . dextrose 125 mL/hr at 11/24/17 0708     LOS: 1 day   Time Spent in minutes   45 minutes (greater than 50% of time spent with patient face to face, as well as reviewing old records, and  formulating a plan)  Jaanvi Fizer D.O. on 11/24/2017 at 12:25 PM  Between 7am to 7pm - Pager -  [phone]  After 7pm go to [email or web address] - password [email or web address]  And look for the night coverage person covering for me after hours  [facility] Office  [phone]

## 2017-11-24 NOTE — Clinical Social Work Note (Addendum)
15:45- following assessment pt worked with therapy and SNF was recommended. Per RN sister visited and expressed concern that he is not at level she can care for at home.  Met with pt again, he states he is familiar with SNF placement process as he went to [facility] for rehab after hospitalization at [facility] in May 2018. However, due to his Medicaid requirement of staying at least 30 days at [facility] and signing over income check for room & board during stay, he is unsure if he would agree to go to a SNF again.  CSW processed with him his options and pt agreed to allow CSW to make referrals to find out SNF availability. States he will think over his situation and options tonight and have CSW check back in tomorrow for his decision.  CSW requested to discuss this with pt's family as well as he resides with sister, and he asks that CSW not discuss with sister yet, check back tomorrow. Completed FL2 and made referrals- will follow up with pt tomorrow.    Clinical Social Work Assessment  Patient Details  Name: Samuel Gallagher MRN: [id number] Date of Birth: Oct 03, 1955  Date of referral:  11/24/17               Reason for consult:  Financial Concerns                Permission sought to share information with:    Permission granted to share information::     Name::        Agency::     Relationship::     Contact Information:     Housing/Transportation Living arrangements for the past 2 months:  Single Family Home Source of Information:  Patient Patient Interpreter Needed:  None Criminal Activity/Legal Involvement Pertinent to Current Situation/Hospitalization:  No - Comment as needed Significant Relationships:  Siblings Lives with:  Siblings Do you feel safe going back to the place where you live?  Yes Need for family participation in patient care:  No (Coment)  Care giving concerns:  Pt admitted from home where he resides with his sister. Explains he is from [place] and [place] is through  [employer]. He states he was living in an apartment in [place] but was having trouble paying rent so came to [place] to live with his sister and brother. At baseline uses walker to ambulate or wheelchair when he is too weak to bear weight. States he had knee replacement surgery in the past and has had issues with ambulating ever since   Facilities manager / plan:  CSW consulted to assess financial concerns. Met with pt at bedside- he was alert, oriented, engaged, and pleasant. Somewhat difficult to understand his speech at times. Pt states he has Medicaid and disability through [place] he receives [id number] per month "and was told he could qualify for more, someone at social security told me to call them Wednesday, that maybe I could get $800 something a month." Pt asked for number for [place] located and provided for pt.  Pt expressed thanks. Reports plan to return to sister's home at [place].  Employment status:  Disabled (Comment on whether or not currently receiving Disability)(receives disability) Insurance information:  Medicaid In [place] PT Recommendations:  Not assessed at this time Information / Referral to community resources:     Patient/Family's Response to care:  Pt very appreciative of care  Patient/Family's Understanding of and Emotional Response to Diagnosis, Current  Treatment, and Prognosis:  Did not discuss current hospital treatment with pt. Emotionally pt was very pleasant, making jokes appropriately with SW and was good informant re: the topics of this assessment.  Emotional Assessment Appearance:  Appears stated age Attitude/Demeanor/Rapport:  Engaged Affect (typically observed):  Pleasant Orientation:  Oriented to Self, Oriented to Place, Oriented to  Time, Oriented to Situation Alcohol / Substance use:  Not Applicable Psych involvement (Current and /or in the community):  No (Comment)  Discharge Needs  Concerns to be  addressed:  No discharge needs identified Readmission within the last 30 days:  No Current discharge risk:  None Barriers to Discharge:  Continued Medical Work up   [employer], LCSW 11/24/2017, 11:49 AM [phone]

## 2017-11-24 NOTE — Evaluation (Signed)
Physical Therapy Evaluation Patient Details Name: Samuel Gallagher MRN: [id number] DOB: Mar 31, 1956 Today's Date: 11/24/2017   History of Present Illness  62 year old male presenting with generalized weakness, loose stools.  Unclear whether compliant with desmopressin.  Admitted for hypernatremia, acute kidney injury.  PMH significant for TBI, sick sinus syndrome with PPM, and cranial diabetes insipidus  Clinical Impression  Pt admitted with above diagnosis. Pt currently with functional limitations due to the deficits listed below (see PT Problem List).  Pt will benefit from skilled PT to increase their independence and safety with mobility to allow discharge to the venue listed below.   Pt reports bil knee pain (R chronic and pt reports need for TKA, and new L knee pain) and fall on knees prior to admission, also presents with bilateral scabs over anterior knees.    Pt appears somewhat confused however no family present.  Recommend SNF upon d/c as pt reports living with his sister.     Follow Up Recommendations SNF;Supervision/Assistance - 24 hour    Equipment Recommendations  None recommended by PT    Recommendations for Other Services       Precautions / Restrictions Precautions Precautions: Fall Restrictions Weight Bearing Restrictions: No      Mobility  Bed Mobility Overal bed mobility: Needs Assistance             General bed mobility comments: pt able to reposition up [facility] with use of rails, pt able to move extremities however declines sitting EOB due to pain in L knee and reports of his R knee requiring TKA, continued to refuse despite encouragement  Transfers                    Ambulation/Gait                Stairs            Wheelchair Mobility    Modified Rankin (Stroke Patients Only)       Balance Overall balance assessment: History of Falls                                           Pertinent Vitals/Pain Pain  Assessment: 0-10 Pain Score: 7  Pain Location: L knee Pain Descriptors / Indicators: Sore Pain Intervention(s): Limited activity within patient's tolerance;Monitored during session(RN notified)    Home Living Family/patient expects to be discharged to:: Private residence Living Arrangements: Other relatives(sister) Available Help at Discharge: Family;Available 24 hours/day Type of Home: House Home Access: Level entry     Home Layout: One level Home Equipment: Walker - 2 wheels;Cane - single point;Shower seat      Prior Function Level of Independence: Independent with assistive device(s)         Comments: reports he lives with his sister, uses RW for ambulation in community     Hand Dominance        Extremity/Trunk Assessment   Upper Extremity Assessment Upper Extremity Assessment: Generalized weakness;RUE deficits/detail RUE Deficits / Details: splint in place over 4th and 5th digits, wrist    Lower Extremity Assessment Lower Extremity Assessment: Generalized weakness(able to perform active movement in bed)       Communication   Communication: No difficulties  Cognition Arousal/Alertness: Awake/alert Behavior During Therapy: WFL for tasks assessed/performed Overall Cognitive Status: No family/caregiver present to determine baseline cognitive functioning  General Comments: pt appears slightly confused, able to provide current living situation however states he uses both RW and W/C, fall out of w/c, unable to provide clear reason for his admission      General Comments General comments (skin integrity, edema, etc.): pt reports "falling out of wheelchair", has scabs over bil knees    Exercises     Assessment/Plan    PT Assessment Patient needs continued PT services  PT Problem List Decreased strength;Decreased mobility;Decreased balance;Decreased knowledge of use of DME;Decreased cognition;Decreased activity  tolerance;Pain       PT Treatment Interventions DME instruction;Therapeutic activities;Gait training;Therapeutic exercise;Wheelchair mobility training;Patient/family education;Balance training;Functional mobility training    PT Goals (Current goals can be found in the Care Plan section)  Acute Rehab PT Goals PT Goal Formulation: Patient unable to participate in goal setting Time For Goal Achievement: 12/08/17 Potential to Achieve Goals: Fair    Frequency Min 2X/week   Barriers to discharge        Co-evaluation               AM-PAC PT "6 Clicks" Daily Activity  Outcome Measure Difficulty turning over in bed (including adjusting bedclothes, sheets and blankets)?: Unable Difficulty moving from lying on back to sitting on the side of the bed? : Unable Difficulty sitting down on and standing up from a chair with arms (e.g., wheelchair, bedside commode, etc,.)?: Unable Help needed moving to and from a bed to chair (including a wheelchair)?: Total Help needed walking in hospital room?: Total Help needed climbing 3-5 steps with a railing? : Total 6 Click Score: 6    End of Session   Activity Tolerance: Patient limited by pain Patient left: in bed;with bed alarm set;with call bell/phone within reach Nurse Communication: Mobility status PT Visit Diagnosis: Difficulty in walking, not elsewhere classified (R26.2);History of falling (Z91.81)    Time: 1610-9604 PT Time Calculation (min) (ACUTE ONLY): 12 min   Charges:   PT Evaluation $PT Eval Low Complexity: 1 Low     PT G Codes:        Zenovia Jarred, PT, DPT 11/24/2017 Pager: [phone]  Maida Sale E 11/24/2017, 12:01 PM

## 2017-11-24 NOTE — NC FL2 (Signed)
[place] MEDICAID FL2 LEVEL OF CARE SCREENING TOOL     IDENTIFICATION  Patient Name: Samuel Gallagher Birthdate: 08/20/1955 Sex: male Admission Date (Current Location): 11/22/2017  [facility] and [place] Number:  Producer, television/film/video and Address:  [facility],  [address], [place] [address]      Provider Number: [id number]  Attending Physician Name and Address:  Edsel Petrin, DO  Relative Name and Phone Number:       Current Level of Care: Hospital Recommended Level of Care: Skilled Nursing Facility Prior Approval Number:    Date Approved/Denied:   PASRR Number: [id number]  Discharge Plan: SNF    Current Diagnoses: Patient Active Problem List   Diagnosis Date Noted  . Hypernatremia 11/23/2017  . Severe protein-calorie malnutrition (HCC) 11/23/2017  . Acute kidney injury (HCC) 09/16/2017  . DI (diabetes insipidus) (HCC) 09/16/2017  . Acute hypernatremia 09/16/2017  . Pacemaker 09/16/2017  . HLD (hyperlipidemia) 09/16/2017  . TBI 09/16/2017  . Low serum vitamin B12 01/17/2017  . DDD (degenerative disc disease), lumbar 01/17/2017  . Diabetes insipidus (HCC) 12/25/2016  . Chronic gout 12/25/2016  . Sick sinus syndrome (HCC) 12/25/2016  . Artificial pacemaker 12/25/2016  . Anemia of chronic disease 12/25/2016  . Thrombocytopenia (HCC) 12/25/2016  . Chronic hypernatremia 12/20/2016  . CKD (chronic kidney disease) stage 3, GFR 30-59 ml/min (HCC) 08/24/2014    Orientation RESPIRATION BLADDER Height & Weight     Self, Time, Situation, Place  Normal Incontinent, External catheter Weight: 188 lb 7.9 oz (85.5 kg) Height:   (170.2 cm)  BEHAVIORAL SYMPTOMS/MOOD NEUROLOGICAL BOWEL NUTRITION STATUS      Continent Diet(regular)  AMBULATORY STATUS COMMUNICATION OF NEEDS Skin   Extensive Assist Verbally Normal                       Personal Care Assistance Level of Assistance  Bathing, Feeding, Dressing Bathing Assistance: Limited  assistance Feeding assistance: Independent Dressing Assistance: Limited assistance     Functional Limitations Info  Sight, Hearing, Speech Sight Info: Adequate Hearing Info: Adequate Speech Info: Impaired(difficult to understand speech at times)    SPECIAL CARE FACTORS FREQUENCY  PT (By licensed PT), OT (By licensed OT)     PT Frequency: 5x OT Frequency: 5x            Contractures Contractures Info: Not present    Additional Factors Info  Code Status, Allergies Code Status Info: full code Allergies Info: nka           Current Medications (11/24/2017):  This is the current hospital active medication list Current Facility-Administered Medications  Medication Dose Route Frequency Provider Last Rate Last Dose  . acetaminophen (TYLENOL) tablet 650 mg  650 mg Oral Q6H PRN Pearson Grippe, MD       Or  . acetaminophen (TYLENOL) suppository 650 mg  650 mg Rectal Q6H PRN Pearson Grippe, MD      . allopurinol (ZYLOPRIM) tablet 100 mg  100 mg Oral Daily Standley Brooking, MD   100 mg at 11/24/17 0919  . desmopressin (DDAVP) tablet 0.05 mg  0.05 mg Oral BID Standley Brooking, MD   0.05 mg at 11/24/17 0919  . dextrose 5 % solution   Intravenous Continuous Raeford Razor, MD 125 mL/hr at 11/24/17 0708    . feeding supplement (ENSURE ENLIVE) (ENSURE ENLIVE) liquid 237 mL  237 mL Oral BID BM Pearson Grippe, MD   237 mL at 11/24/17 0921  . feeding  supplement (PRO-STAT SUGAR FREE 64) liquid 30 mL  30 mL Oral BID Pearson Grippe, MD   30 mL at 11/24/17 0920  . sodium chloride flush (NS) 0.9 % injection 10-40 mL  10-40 mL Intracatheter PRN Standley Brooking, MD      . sodium chloride flush (NS) 0.9 % injection 10-40 mL  10-40 mL Intracatheter Q12H Standley Brooking, MD   10 mL at 11/24/17 0921  . sodium chloride flush (NS) 0.9 % injection 10-40 mL  10-40 mL Intracatheter PRN Standley Brooking, MD         Discharge Medications: Please see discharge summary for a list of discharge  medications.  Relevant Imaging Results:  Relevant Lab Results:   Additional Information SSN: [id number] [address] [place], [place]

## 2017-11-25 DIAGNOSIS — E876 Hypokalemia: Secondary | ICD-10-CM

## 2017-11-25 LAB — BASIC METABOLIC PANEL
ANION GAP: 7 (ref 5–15)
BUN: 31 mg/dL — ABNORMAL HIGH (ref 6–20)
CALCIUM: 9 mg/dL (ref 8.9–10.3)
CHLORIDE: 119 mmol/L — AB (ref 101–111)
CO2: 24 mmol/L (ref 22–32)
Creatinine, Ser: 1.54 mg/dL — ABNORMAL HIGH (ref 0.61–1.24)
GFR calc non Af Amer: 47 mL/min — ABNORMAL LOW (ref >60)
GFR, EST AFRICAN AMERICAN: 54 mL/min — AB (ref >60)
GLUCOSE: 114 mg/dL — AB (ref 65–99)
POTASSIUM: 3.2 mmol/L — AB (ref 3.5–5.1)
Sodium: 150 mmol/L — ABNORMAL HIGH (ref 135–145)

## 2017-11-25 LAB — CBC
HEMATOCRIT: 34.9 % — AB (ref 39.0–52.0)
HEMOGLOBIN: 10.6 g/dL — AB (ref 13.0–17.0)
MCH: 28.1 pg (ref 26.0–34.0)
MCHC: 30.4 g/dL (ref 30.0–36.0)
MCV: 92.6 fL (ref 78.0–100.0)
Platelets: 80 10*3/uL — ABNORMAL LOW (ref 150–400)
RBC: 3.77 MIL/uL — ABNORMAL LOW (ref 4.22–5.81)
RDW: 14.8 % (ref 11.5–15.5)
WBC: 4.6 10*3/uL (ref 4.0–10.5)

## 2017-11-25 MED ORDER — POTASSIUM CHLORIDE CRYS ER 20 MEQ PO TBCR
40.0000 meq | EXTENDED_RELEASE_TABLET | Freq: Once | ORAL | Status: AC
  Administered 2017-11-25: 40 meq via ORAL
  Filled 2017-11-25: qty 2

## 2017-11-25 NOTE — Plan of Care (Signed)
  Problem: Education: Goal: Knowledge of General Education information will improve Outcome: Progressing   Problem: Health Behavior/Discharge Planning: Goal: Ability to manage health-related needs will improve Outcome: Progressing   Problem: Clinical Measurements: Goal: Ability to maintain clinical measurements within normal limits will improve Outcome: Progressing Goal: Will remain free from infection Outcome: Progressing   Problem: Coping: Goal: Level of anxiety will decrease Outcome: Progressing   Problem: Pain Managment: Goal: General experience of comfort will improve Outcome: Progressing   Problem: Skin Integrity: Goal: Risk for impaired skin integrity will decrease Outcome: Progressing   Problem: Activity: Goal: Risk for activity intolerance will decrease Outcome: Not Progressing   Problem: Clinical Measurements: Goal: Respiratory complications will improve Outcome: Adequate for Discharge Goal: Cardiovascular complication will be avoided Outcome: Adequate for Discharge

## 2017-11-25 NOTE — Progress Notes (Signed)
PROGRESS NOTE    Samuel Gallagher  ZOX:[id number] DOB: May 22, 1956 DOA: 11/22/2017 PCP: [name], MD   Brief Narrative:  HPI On 11/23/2017 by Dr. Pearson Grippe Samuel Gallagher  is a 62 y.o. male, w Diabetes Insipidus, CKD stage 3, Anemia,  hyperlipidemia, Gerd, PUD, Gout, pacer presents with c/o fatigue/ generalized weakness. Pt states has had slight loose stool 1x per day for the past 3 days.  Pt denies fever, chills, cough, cp, palp, sob, n/v, abd pain, brbpr.  There is some question as to whether he was taking his desmopressin or not.  Pt presented to ED for evaluation.   Interim history Admitted for hypernatremia.  Assessment & Plan   Hypernatremia secondary to diabetes insipidus/dehydration -Patient presented with dehydration and diarrhea -Suspect hyponatremia may also be caused by some noncompliance of medication -PICC line placed this patient lost IV access -Continue desmopressin, IV fluid with D5 -presented with sodium 168, down to 150 -Continue to monitor BMP  Acute kidney injury on chronic kidney disease, stage 3 -Secondary to poor oral intake as well as diarrhea and diabetes insipidus -Baseline creatinine approximately 1.5-1.7, upon admission creatinine 2.19 -Continue IV fluids -Creatinine down to 1.54 -Continue to monitor BMP  Diarrhea -patient does not appear to have had further bowel movements since admission -resolved, likely gastroenteritis   Hyperlipidemia -Continue statin  Gout  -Continue allopurinol  Malnutrition, mild -albumin 2.8 -Nutrition consulted, continue feeding supplements  Hypokalemia -will replace and monitor BMP  DVT Prophylaxis  SCDs  Code Status: Full  Family Communication: None at bedside  Disposition Plan: Admitted. Pending improvement in sodium level- likely SNF within 24 hours  Consultants None  Procedures  None  Antibiotics   Anti-infectives (From admission, onward)   None      Subjective:   Samuel Gallagher seen  and examined today.  Feeling better today. Denis chest pain, shortness of breath, abdominal pain, nausea vomiting, diarrhea, constipation.   Objective:   Vitals:   11/24/17 0555 11/24/17 1343 11/24/17 2127 11/25/17 0620  BP:  (!) 94/53 (!) 104/54 103/64  Pulse: 64 (!) 59 62 (!) 59  Resp:  Temp:  98.4 F (36.9 C) 98.1 F (36.7 C) 98 F (36.7 C)  TempSrc:  Oral    SpO2:  96% 94% 100%  Weight:    90.4 kg (199 lb 4.7 oz)  Height:        Intake/Output Summary (Last 24 hours) at 11/25/2017 1415 Last data filed at 11/25/2017 1054 Gross per 24 hour  Intake 630 ml  Output -  Net 630 ml   Filed Weights   11/23/17 0218 11/24/17 0540 11/25/17 8119  Weight: 83.2 kg (183 lb 6.8 oz) 85.5 kg (188 lb 7.9 oz) 90.4 kg (199 lb 4.7 oz)   Exam  General: Well developed, well nourished, NAD, appears stated age  HEENT: NCAT, mucous membranes moist.   Neck: Supple  Cardiovascular: S1 S2 auscultated, RRR, no murmur  Respiratory: Clear to auscultation bilaterally with equal chest rise  Abdomen: Soft, nontender, nondistended, + bowel sounds  Extremities: warm dry without cyanosis clubbing or edema  Neuro: AAOx3, nonfocal  Psych: Normal affect and demeanor   Data Reviewed: I have personally reviewed following labs and imaging studies  CBC: Recent Labs  Lab 11/22/17 1704 11/23/17 0509 11/25/17 0423  WBC 4.6 4.7 4.6  NEUTROABS 2.7  --   --   HGB 13.2 12.6* 10.6*  HCT 44.5 43.9 34.9*  MCV 96.3 96.5 92.6  PLT 94*  97* 80*   Basic Metabolic Panel: Recent Labs  Lab 11/23/17 0509 11/23/17 1724 11/23/17 2246 11/24/17 0809 11/25/17 0423  NA 167* 166* 162* 157* 150*  K 3.6 3.6 3.7 3.3* 3.2*  CL >130* 137* >130* 126* 119*  CO2 GLUCOSE 93 117* 145* 122* 114*  BUN 39* 33* 36* 32* 31*  CREATININE 1.90* 1.88* 1.76* 1.61* 1.54*  CALCIUM 9.8 9.6 9.3 9.2 9.0   GFR: Estimated Creatinine Clearance: 54 mL/min (A) (by C-G formula based on SCr of 1.54 mg/dL  (H)). Liver Function Tests: Recent Labs  Lab 11/22/17 2301 11/23/17 0509  AST 32 27  ALT 34 32  ALKPHOS 142* 142*  BILITOT 0.5 0.4  PROT 6.7 6.9  ALBUMIN 2.7* 2.8*   No results for input(s): LIPASE, AMYLASE in the last 168 hours. No results for input(s): AMMONIA in the last 168 hours. Coagulation Profile: No results for input(s): INR, PROTIME in the last 168 hours. Cardiac Enzymes: No results for input(s): CKTOTAL, CKMB, CKMBINDEX, TROPONINI in the last 168 hours. BNP (last 3 results) No results for input(s): PROBNP in the last 8760 hours. HbA1C: No results for input(s): HGBA1C in the last 72 hours. CBG: No results for input(s): GLUCAP in the last 168 hours. Lipid Profile: No results for input(s): CHOL, HDL, LDLCALC, TRIG, CHOLHDL, LDLDIRECT in the last 72 hours. Thyroid Function Tests: No results for input(s): TSH, T4TOTAL, FREET4, T3FREE, THYROIDAB in the last 72 hours. Anemia Panel: No results for input(s): VITAMINB12, FOLATE, FERRITIN, TIBC, IRON, RETICCTPCT in the last 72 hours. Urine analysis:    Component Value Date/Time   COLORURINE YELLOW 11/22/2017 1704   APPEARANCEUR CLEAR 11/22/2017 1704   LABSPEC 1.028 11/22/2017 1704   PHURINE 5.0 11/22/2017 1704   GLUCOSEU NEGATIVE 11/22/2017 1704   HGBUR NEGATIVE 11/22/2017 1704   BILIRUBINUR NEGATIVE 11/22/2017 1704   KETONESUR NEGATIVE 11/22/2017 1704   PROTEINUR NEGATIVE 11/22/2017 1704   NITRITE NEGATIVE 11/22/2017 1704   LEUKOCYTESUR NEGATIVE 11/22/2017 1704   Sepsis Labs: (procalcitonin:4,lacticidven:4)  ) Recent Results (from the past 240 hour(s))  MRSA PCR Screening     Status: None   Collection Time: 11/23/17  5:19 AM  Result Value Ref Range Status   MRSA by PCR NEGATIVE NEGATIVE Final    Comment:        The GeneXpert MRSA Assay (FDA approved for NASAL specimens only), is one component of a comprehensive MRSA colonization surveillance program. It is not intended to diagnose  MRSA infection nor to guide or monitor treatment for MRSA infections. Performed at [facility], [address], [place], [place] [address]       Radiology Studies: No results found.   Scheduled Meds: . allopurinol  100 mg Oral Daily  . desmopressin  0.05 mg Oral BID  . feeding supplement (ENSURE ENLIVE)  237 mL Oral BID BM  . feeding supplement (PRO-STAT SUGAR FREE 64)  30 mL Oral BID  . sodium chloride flush  10-40 mL Intracatheter Q12H   Continuous Infusions: . dextrose 125 mL/hr at 11/25/17 0802     LOS: 2 days   Time Spent in minutes   30 minutes  Chloie Loney D.O. on 11/25/2017 at 2:15 PM  Between 7am to 7pm - Pager - [phone]  After 7pm go to [email or web address] - password [email or web address]  And look for the night coverage person covering for me after hours  [facility] Office  [phone]

## 2017-11-26 LAB — BASIC METABOLIC PANEL
ANION GAP: 6 (ref 5–15)
BUN: 22 mg/dL — ABNORMAL HIGH (ref 6–20)
CALCIUM: 8.8 mg/dL — AB (ref 8.9–10.3)
CO2: 25 mmol/L (ref 22–32)
CREATININE: 1.26 mg/dL — AB (ref 0.61–1.24)
Chloride: 109 mmol/L (ref 101–111)
GFR calc non Af Amer: 60 mL/min — ABNORMAL LOW (ref >60)
Glucose, Bld: 111 mg/dL — ABNORMAL HIGH (ref 65–99)
Potassium: 3.2 mmol/L — ABNORMAL LOW (ref 3.5–5.1)
SODIUM: 140 mmol/L (ref 135–145)

## 2017-11-26 LAB — CBC
HCT: 33 % — ABNORMAL LOW (ref 39.0–52.0)
Hemoglobin: 10.3 g/dL — ABNORMAL LOW (ref 13.0–17.0)
MCH: 27.9 pg (ref 26.0–34.0)
MCHC: 31.2 g/dL (ref 30.0–36.0)
MCV: 89.4 fL (ref 78.0–100.0)
PLATELETS: 76 10*3/uL — AB (ref 150–400)
RBC: 3.69 MIL/uL — ABNORMAL LOW (ref 4.22–5.81)
RDW: 14 % (ref 11.5–15.5)
WBC: 4.4 10*3/uL (ref 4.0–10.5)

## 2017-11-26 LAB — HEMOGLOBIN AND HEMATOCRIT, BLOOD
HEMATOCRIT: 33 % — AB (ref 39.0–52.0)
HEMOGLOBIN: 10.3 g/dL — AB (ref 13.0–17.0)

## 2017-11-26 LAB — MAGNESIUM: MAGNESIUM: 1.6 mg/dL — AB (ref 1.7–2.4)

## 2017-11-26 MED ORDER — ENSURE ENLIVE PO LIQD: 237.0000 mL | Freq: Two times a day (BID) | ORAL | 0 refills | Status: AC

## 2017-11-26 MED ORDER — POLYETHYLENE GLYCOL 3350 17 G PO PACK
17.0000 g | PACK | Freq: Every day | ORAL | Status: DC
  Administered 2017-11-26: 17 g via ORAL
  Filled 2017-11-26: qty 1

## 2017-11-26 MED ORDER — BISACODYL 10 MG RE SUPP: 10.0000 mg | Freq: Every day | RECTAL | Status: DC | PRN

## 2017-11-26 MED ORDER — PRO-STAT SUGAR FREE PO LIQD: 30.0000 mL | Freq: Two times a day (BID) | ORAL | 0 refills | Status: AC

## 2017-11-26 MED ORDER — MAGNESIUM SULFATE 2 GM/50ML IV SOLN
2.0000 g | Freq: Once | INTRAVENOUS | Status: AC
  Administered 2017-11-26: 2 g via INTRAVENOUS
  Filled 2017-11-26: qty 50

## 2017-11-26 MED ORDER — POTASSIUM CHLORIDE CRYS ER 20 MEQ PO TBCR
40.0000 meq | EXTENDED_RELEASE_TABLET | Freq: Two times a day (BID) | ORAL | Status: DC
  Administered 2017-11-26: 40 meq via ORAL
  Filled 2017-11-26: qty 2

## 2017-11-26 MED ORDER — MORPHINE SULFATE (PF) 4 MG/ML IV SOLN
2.0000 mg | Freq: Once | INTRAVENOUS | Status: AC
  Administered 2017-11-26: 2 mg via INTRAVENOUS
  Filled 2017-11-26: qty 1

## 2017-11-26 NOTE — Clinical Social Work Placement (Signed)
Pt discharged with plan to admit to [facility] SNF- report #[phone]. Will arrange PTAR transport Sister katherine at bedside- agreeable to plan  DC information provided via the HUB  CLINICAL SOCIAL WORK PLACEMENT  NOTE  Date:  11/26/2017  Patient Details  Name: Samuel Gallagher MRN: [id number] Date of Birth: [birth year]  Clinical Social Work is seeking post-discharge placement for this patient at the Skilled  Nursing Facility level of care (*CSW will initial, date and re-position this form in  chart as items are completed):  Yes   Patient/family provided with [facility] Clinical Social Work Department's list of facilities offering this level of care within the geographic area requested by the patient (or if unable, by the patient's family).  Yes   Patient/family informed of their freedom to choose among providers that offer the needed level of care, that participate in Medicare, Medicaid or managed care program needed by the patient, have an available bed and are willing to accept the patient.  Yes   Patient/family informed of [facility]'s ownership interest in [facility] and [facility], as well as of the fact that they are under no obligation to receive care at these facilities.  PASRR submitted to EDS on       PASRR number received on       Existing PASRR number confirmed on 11/24/17     FL2 transmitted to all facilities in geographic area requested by pt/family on 11/24/17     FL2 transmitted to all facilities within larger geographic area on       Patient informed that his/her managed care company has contracts with or will negotiate with certain facilities, including the following:        Yes   Patient/family informed of bed offers received.  Patient chooses bed at [facility]     Physician recommends and patient chooses bed at [facility]    Patient to be transferred to [facility] on 11/26/17.  Patient to be transferred to facility by PTAR     Patient  family notified on 11/26/17 of transfer.  Name of family member notified:  sister [facility]       Additional Comment:    _______________________________________________ Nelwyn Salisbury, LCSW 11/26/2017, 2:17 PM  [phone]

## 2017-11-26 NOTE — Discharge Summary (Signed)
Physician Discharge Summary  Commodore Bellew UJW:[id number] DOB: 1956/06/12 DOA: 11/22/2017  PCP: [name], MD  Admit date: 11/22/2017 Discharge date: 11/26/2017  Time spent: 45 minutes  Recommendations for Outpatient Follow-up:  Patient will be discharged to skilled nursing facility, continue physical and occupational therapy.  Patient will need to follow up with primary care provider within one week of discharge, repeat BMP and magnesium .  Patient should continue medications as prescribed.  Patient should follow a regular diet.   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 Pearson Grippe LawrenceOrmondis a61 y.o.male,w Diabetes Insipidus, CKD stage 3, Anemia, hyperlipidemia, Gerd, PUD, Gout, pacer presents with c/o fatigue/ generalized weakness. Pt states has had slight loose stool 1x per day for the past 3 days. Pt denies fever, chills, cough, cp, palp, sob, n/v, abd pain, brbpr. There is some question as to whether he was taking his desmopressin or not. Pt presented to ED for evaluation.   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 -Patient presented with dehydration and diarrhea -Suspect hyponatremia may also be caused by some noncompliance of medication -PICC line placed this patient lost IV access -Continue desmopressin -was placed on IVF -presented with sodium 168, down to 140 -Repeat BMP in one week  Acute kidney injury on chronic kidney disease, stage 3 -Secondary to poor oral intake as well as diarrhea and diabetes  insipidus -Baseline creatinine approximately 1.5-1.7, upon admission creatinine 2.19 -was placed on IV fluids -Creatinine down to 1.26 -repeat BMP in one week  Diarrhea -patient does not appear to have had further bowel movements since admission -resolved, likely gastroenteritis   Hyperlipidemia -Continue statin  Gout  -Continue allopurinol  Malnutrition, mild -albumin 2.8 -Nutrition consulted, continue feeding supplements  Hypokalemia/hypomagnesemia -Replaced, repeat BMP/Magnesium in one week  Procedures: None  Consultations: None  Discharge Exam: Vitals:   11/25/17 2156 11/26/17 0522  BP: 105/62 117/68  Pulse: 60 60  Resp: 16 18  Temp: 97.8 F (36.6 C) 98.4 F (36.9 C)  SpO2: 100% 97%     General: Well developed, well nourished, NAD, appears stated age  HEENT: NCAT, mucous membranes moist.  Cardiovascular: S1 S2 auscultated, RRR, no murmur  Respiratory: Clear to auscultation bilaterally with equal chest rise  Abdomen: Soft, nontender, nondistended, + bowel sounds  Extremities: warm dry without cyanosis clubbing or edema  Neuro: AAOx3, nonfocal  Psych: Appropriate mood and affect  Discharge Instructions Discharge Instructions    Discharge instructions   Complete by:  As directed    Patient will be discharged to skilled nursing facility, continue physical and occupational therapy.  Patient will need to follow up with primary care provider within one week of discharge, repeat BMP and magnesium .  Patient should continue medications as prescribed.  Patient should follow a regular diet.     Allergies as of 11/26/2017      Reactions   No Known Allergies       Medication List    STOP taking these medications   cyclobenzaprine 5 MG tablet Commonly known as:  FLEXERIL     TAKE these medications  allopurinol 100 MG tablet Commonly known as:  ZYLOPRIM Take 1 tablet (100 mg total) by mouth daily.   desmopressin 0.1 MG tablet Commonly known  as:  DDAVP Take 0.5 tablets (0.05 mg total) by mouth 2 (two) times daily.   feeding supplement (ENSURE ENLIVE) Liqd Take 237 mLs by mouth 2 (two) times daily between meals.   feeding supplement (PRO-STAT SUGAR FREE 64) Liqd Take 30 mLs by mouth 2 (two) times daily.   mupirocin ointment 2 % Commonly known as:  BACTROBAN Place 1 application into the nose daily.   rosuvastatin 10 MG tablet Commonly known as:  CRESTOR Take 1 tablet (10 mg total) by mouth daily.      Allergies  Allergen Reactions  . No Known Allergies     Contact information for follow-up providers    [name], MD. Schedule an appointment as soon as possible for a visit in 1 week(s).   Specialty:  Internal Medicine Why:  Hospital follow up Contact information: [address] [place] [address] [phone]            Contact information for after-discharge care    Destination    [facility] .   Service:  Skilled Nursing Contact information: [address] [address] [phone]                   The results of significant diagnostics from this hospitalization (including imaging, microbiology, ancillary and laboratory) are listed below for reference.    Significant Diagnostic Studies: [place] Ekg Site Rite  Result Date: 11/23/2017 If Site Rite image not attached, placement could not be confirmed due to current cardiac rhythm.   Microbiology: Recent Results (from the past 240 hour(s))  MRSA PCR Screening     Status: None   Collection Time: 11/23/17  5:19 AM  Result Value Ref Range Status   MRSA by PCR NEGATIVE NEGATIVE Final    Comment:        The GeneXpert MRSA Assay (FDA approved for NASAL specimens only), is one component of a comprehensive MRSA colonization surveillance program. It is not intended to diagnose MRSA infection nor to guide or monitor treatment for MRSA infections. Performed at [facility], [address] [place], [place] [address]      Labs: Basic Metabolic Panel: Recent Labs  Lab 11/23/17 1724 11/23/17 2246 11/24/17 0809 11/25/17 0423 11/26/17 0457  NA 166* 162* 157* 150* 140  K 3.6 3.7 3.3* 3.2* 3.2*  CL 137* >130* 126* 119* 109  CO2 GLUCOSE 117* 145* 122* 114* 111*  BUN 33* 36* 32* 31* 22*  CREATININE 1.88* 1.76* 1.61* 1.54* 1.26*  CALCIUM 9.6 9.3 9.2 9.0 8.8*  MG  --   --   --   --  1.6*   Liver Function Tests: Recent Labs  Lab 11/22/17 2301 11/23/17 0509  AST 32 27  ALT 34 32  ALKPHOS 142* 142*  BILITOT 0.5 0.4  PROT 6.7 6.9  ALBUMIN 2.7* 2.8*   No results for input(s): LIPASE, AMYLASE in the last 168 hours. No results for input(s): AMMONIA in the last 168 hours. CBC: Recent Labs  Lab 11/22/17 1704 11/23/17 0509 11/25/17 0423 11/26/17 0457  WBC 4.6 4.7 4.6 4.4  NEUTROABS 2.7  --   --   --   HGB 13.2 12.6* 10.6* 10.3*  10.3*  HCT 44.5 43.9 34.9* 33.0*  33.0*  MCV 96.3 96.5 92.6 89.4  PLT 94* 97* 80* 76*   Cardiac Enzymes: No results for input(s): CKTOTAL, CKMB, CKMBINDEX, TROPONINI in the last 168 hours. BNP: BNP (last 3 results) No results for input(s): BNP in the last 8760 hours.  ProBNP (last 3 results) No results for input(s): PROBNP in the last 8760 hours.  CBG: No results for input(s): GLUCAP in the last 168 hours.     Signed:  Edsel Petrin  [facility] 11/26/2017, 1:11 PM

## 2017-11-26 NOTE — Progress Notes (Signed)
Pt has agreed to SNF placement for rehab- agreed to stay at least 30 days per Medicaid requirement.  Discussed bed offers with pt and sister in room, selected [facility]. CSW informed facility and will assist with transfer there at DC. Sister noted she is encouraging pt to consider moving into and ALF once he leaves SNF as "he is getting too hard to care for at home." Sister also encouraging pt to assign a HPOA in case he needs one designated in the future. Pt states he is considering both.  Ilean Skill, MSW, LCSW Clinical Social Work 11/26/2017 [phone]

## 2017-11-26 NOTE — Discharge Instructions (Signed)
Diabetes Insipidus Diabetes insipidus (DI) is a rare condition that causes the body to produce more urine than normal, which leads to thirst and dehydration. The urine is made mostly of water (dilute urine). There are four types of DI:  Central DI. This is the most common type.  Dipsogenic DI.  Nephrogenic DI.  Pregnancy-related DI.  The most common forms are related to decreased production of the hormone that regulates urine production (antidiuretic hormone) or resistance to this hormone. DI can be managed with treatment and is not usually serious. This condition is not related to type 1 or type 2 diabetes mellitus, in which blood sugar (glucose) levels are too high. DI affects mostly adults, but it can happen at any age. What are the causes? Central DI is caused by damage to the pituitary gland or hypothalamus in the brain. Dipsogenic DI is caused by a defect in the thirst mechanism in the brain. This defect causes you to drink too much fluid. These may result from:  Brain surgery.  Infection.  Inflammation.  Brain tumor.  Head injury.  Nephrogenic DI is caused by the kidneys not responding to the antidiuretic hormone in the body. This may result from:  Chronic kidney disease (CKD).  Certain medicines, such as lithium.  Low potassium levels.  High calcium levels.  Pregnancy-related DI is caused by the antidiuretic hormone not working properly in the body. This results from having a temporary form of diabetes mellitus that develops during pregnancy (gestational diabetes mellitus). What are the signs or symptoms? Symptoms of this condition include:  Excessive urination. This means urinating more than 10 cups (2.4 L) during a period of 24 hours.  Excessive thirst.  Frequent nighttime urination (nocturia).  Nausea.  Diarrhea.  How is this diagnosed? This condition may be diagnosed based on:  Your medical history.  A physical exam.  Blood tests.  Urine  tests.  How is this treated? Once your specific type of diabetes insipidus is diagnosed, treatment may include one or more of the following:  Increasing or limiting your fluid intake.  Taking medicines that contain artificial (synthetic) versions of the antidiuretic hormone.  Stopping certain medicines that you take.  Correcting the balance of minerals (electrolytes) in your body.  Changing your diet. You may be put on a low-protein or low-sodium diet.  You may need to visit your health care provider regularly to make sure your condition is being treated properly. You may also need to work with providers who specialize in:  Kidney problems (nephrologist).  Hormone disorders (endocrinologist).  Follow these instructions at home:  Follow instructions from your health care provider about how much fluid and water to drink. You may be directed to drink more fluids and water, or to limit how much fluid and water you drink.  Follow instructions from your health care provider about eating or drinking restrictions.  Take over-the-counter and prescription medicines only as told by your health care provider.  Return to your normal activities as told by your health care provider. Ask your health care provider what activities are safe for you.  If directed, monitor your risk of dehydration in extreme heat.  Keep all follow-up visits as told by your health care provider. This is important.  Carry a medical alert card or wear medical alert jewelry. Contact a health care provider if:  You continue to have symptoms after treatment. Get help right away if:  You have extreme thirst.  You have symptoms of severe dehydration, such as  rapid heart rate, muscle cramps, or confusion. Summary  Diabetes insipidus (DI) is a rare condition that causes the body to produce more urine than normal, which leads to thirst and dehydration.  Follow instructions from your health care provider about eating  or drinking restrictions.  Treatment may include increasing or limiting your fluid intake and correcting the balance of minerals (electrolytes) in your body.  Get help right away if you have symptoms of severe dehydration, such as rapid heart rate, muscle cramps, or confusion. This information is not intended to replace advice given to you by your health care provider. Make sure you discuss any questions you have with your health care provider. Document Released: 07/19/2013 Document Revised: 04/22/2016 Document Reviewed: 04/14/2016 Elsevier Interactive Patient Education  2018 [employer]. Hypernatremia Hypernatremia is when the blood has too much salt (sodium) in it and not enough water. Water and salt must be balanced in the blood. This condition is serious and often an emergency. Follow these instructions at home:  Drink enough fluid to keep your pee (urine) clear or pale yellow.  Take over-the-counter and prescription medicines only as told by your doctor.  Avoid salty processed foods, including canned, jarred, frozen, or boxed foods. Some examples include pickles, frozen dinners, canned soups, potato and corn chips, and olives.  Always drink fluids after exercise.  Always drink fluids after throwing up (vomiting) or having watery poop (diarrhea).  Keep all follow-up visits as told by your doctor. This is important. Contact a doctor if:  You have watery poop.  You throw up.  You have a fever or chills.  You cannot follow advice from your doctor about drinking fluids. Get help right away if:  You feel light-headed or weak.  You have a fast heartbeat.  You get confused.  You get restless.  You get short-tempered.  You have a fit of movements that you cannot control (seizure).  You faint.  You have signs of losing too much body fluid (dehydration), such as: ? Dark pee, or very little or no pee. ? Cracked lips. ? No tears. ? Dry mouth. ? Sunken  eyes. ? Sleepiness. This information is not intended to replace advice given to you by your health care provider. Make sure you discuss any questions you have with your health care provider. Document Released: 07/03/2011 Document Revised: 12/20/2015 Document Reviewed: 11/29/2014 Elsevier Interactive Patient Education  [employer].

## 2017-11-26 NOTE — Progress Notes (Signed)
Pt transferred to [facility], report was given to RN on call. At discharge, pt had tennis shoes that went missing. ED unable to locate missing tennis shoes. RN informed patient that there will be a safety zone entered. No questions or concerns at this time.

## 2018-02-09 ENCOUNTER — Telehealth: Payer: Self-pay

## 2018-02-09 NOTE — Telephone Encounter (Signed)
Pt facility called to confirm that we received the fax so the pt can get a device check

## 2018-02-10 NOTE — Telephone Encounter (Signed)
Spoke with Sande RivesKim Price @ [facility] she stated that she did not know what manufacture of device pt has and that pt cannot tell her either, informed her that pt would need to be seen by a MD and established with an electrophysiologist. At this apt we would check the pacemaker. Informed her that I would send a msg to our scheduler to make this apt

## 2019-07-22 IMAGING — CT CT ABD-PELV W/O CM
2 of 4 series · 16 of 46 positions shown, 18 images · non-contrast
Comparison: None.

CLINICAL DATA: Right-sided abdominal pain for several days, initial
encounter

EXAM:
CT ABDOMEN AND PELVIS WITHOUT CONTRAST
TECHNIQUE: Multidetector CT imaging of the abdomen and pelvis was performed
following the standard protocol without IV contrast.

[Series 3: a/p w/o 5mm · axial · non-contrast · 0.76mm/px · z∈[+775,+1200]mm · 13 of 93 slices shown, 15 images]
[im 4/93  soft-tissue]
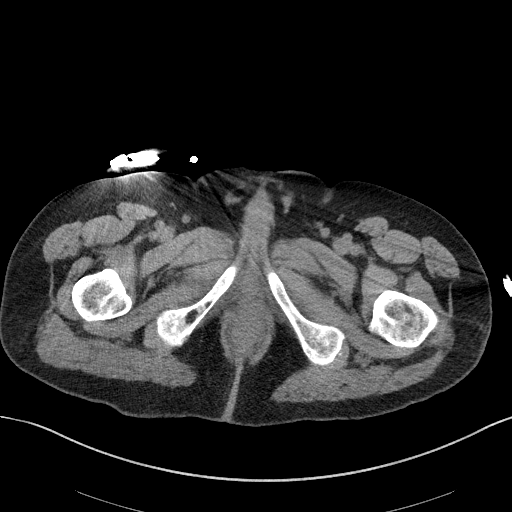
[im 4/93  bone]
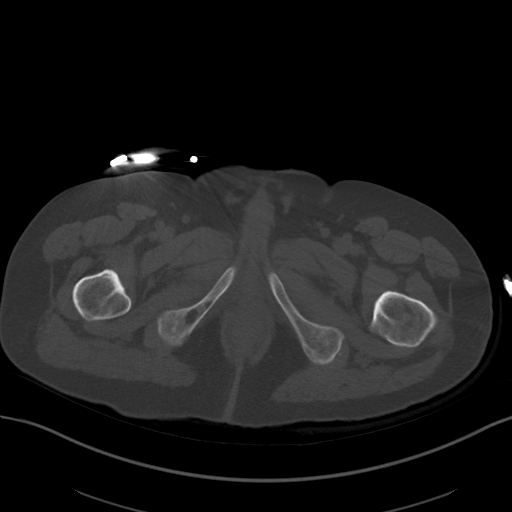
[im 11/93  soft-tissue]
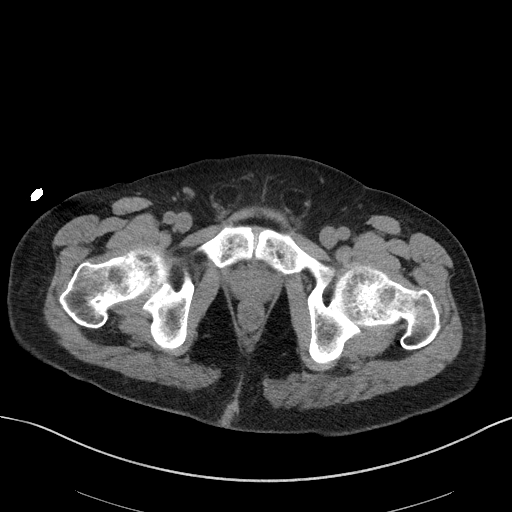
[im 18/93  soft-tissue]
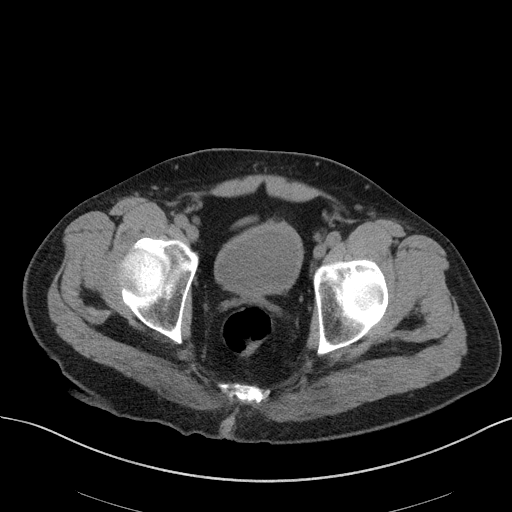
[im 25/93  soft-tissue]
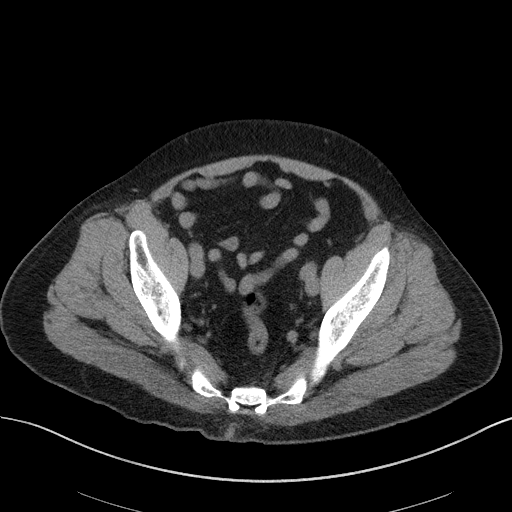
[im 32/93  soft-tissue]
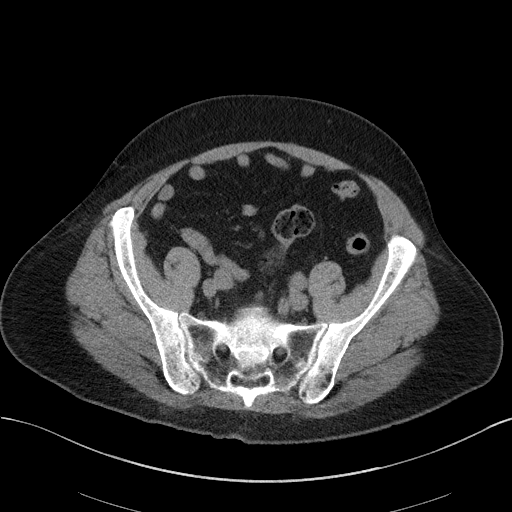
[im 39/93  soft-tissue]
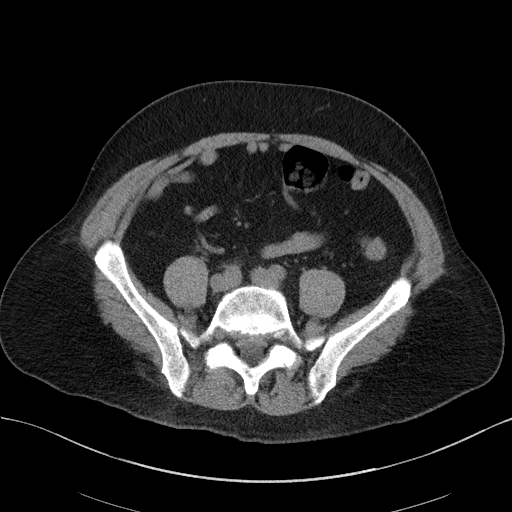
[im 47/93  soft-tissue]
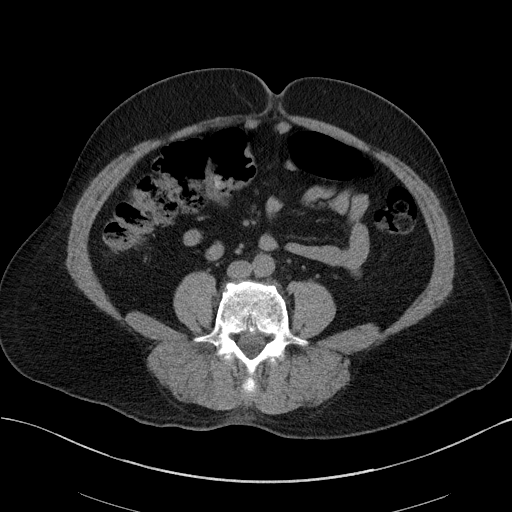
[im 54/93  soft-tissue]
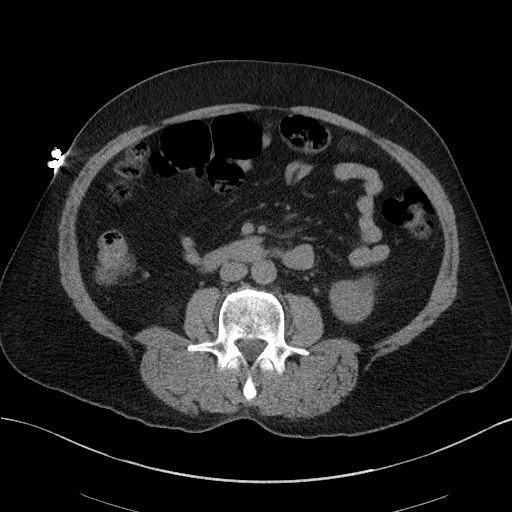
[im 61/93  soft-tissue]
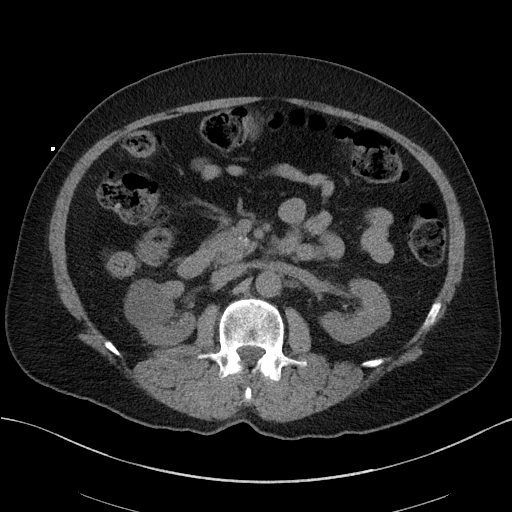
[im 61/93  bone]
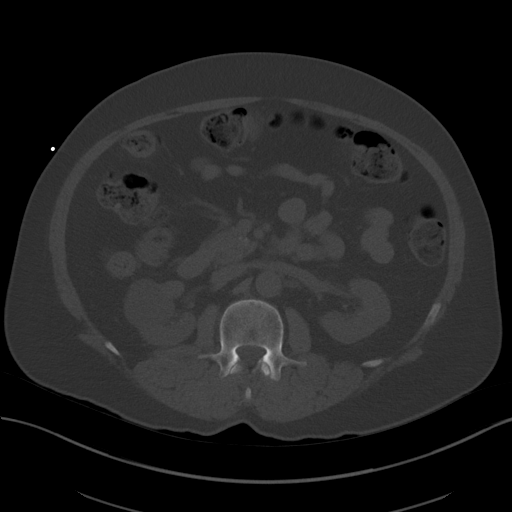
[im 68/93  soft-tissue]
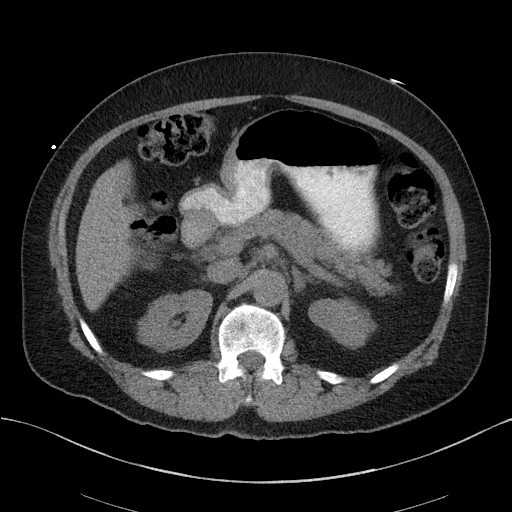
[im 75/93  soft-tissue]
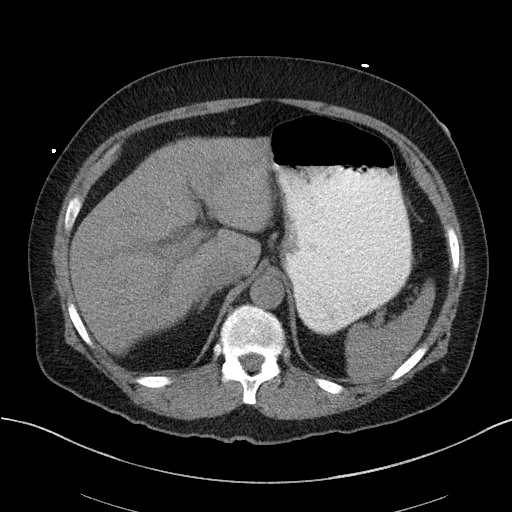
[im 82/93  soft-tissue]
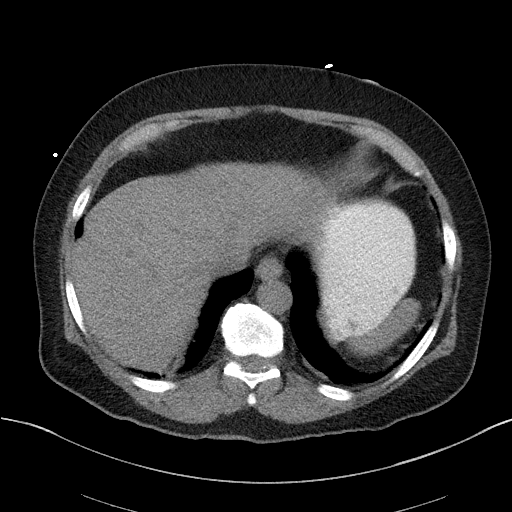
[im 89/93  soft-tissue]
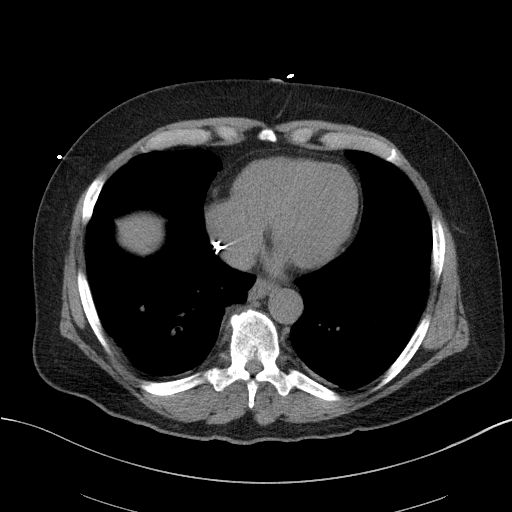

[Series 6: a/p w/o cor · coronal · non-contrast · 0.64mm/px · 3 of 151 slices shown]
[im 51/151  soft-tissue]
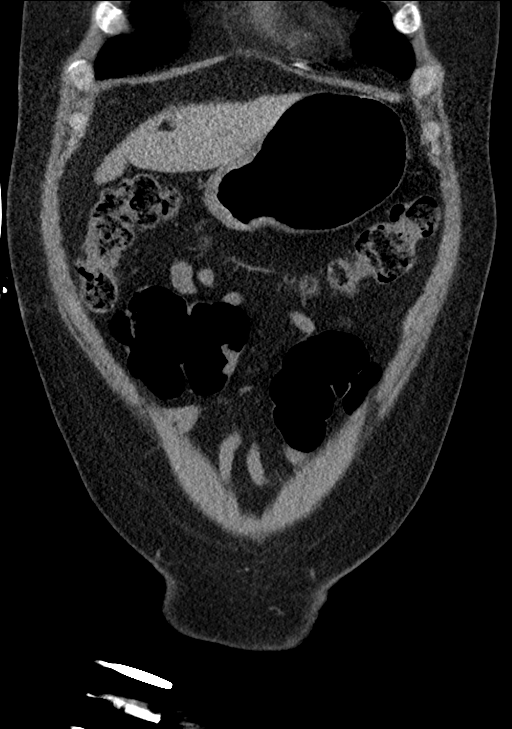
[im 67/151  soft-tissue]
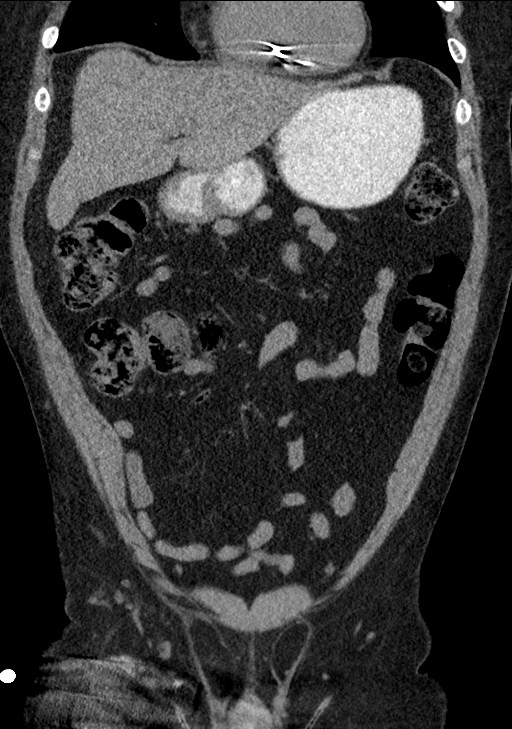
[im 84/151  soft-tissue]
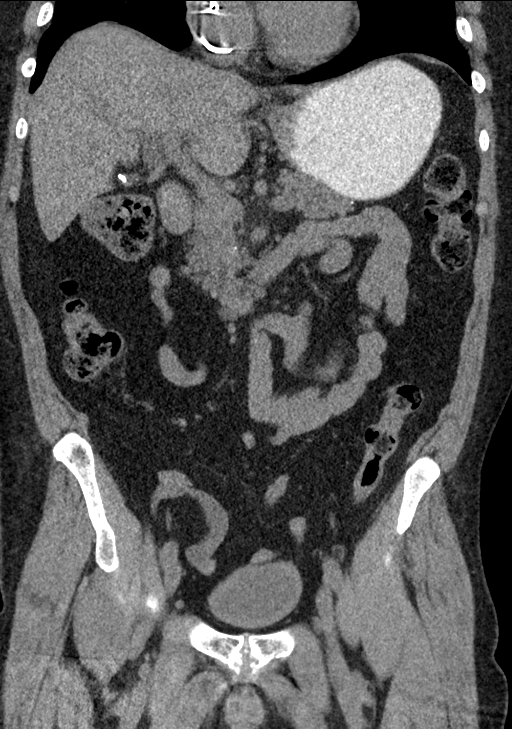

[16 of 46 positions shown; findings below may reference images not displayed]

FINDINGS: Lower chest: Mild right basilar atelectasis is noted. Pacing device
is noted.

Hepatobiliary: No focal liver abnormality is seen. Status post
cholecystectomy. No biliary dilatation.

Pancreas: Unremarkable. No pancreatic ductal dilatation or
surrounding inflammatory changes.

Spleen: Normal in size without focal abnormality.

Adrenals/Urinary Tract: The adrenal glands are within normal limits.
No renal calculi or obstructive changes are noted. Scattered
hypodense lesions are noted throughout both kidneys likely
representing cysts although incompletely evaluated on this exam. The
largest of these lies in the midportion of the right kidney
measuring 4.2 cm. No obstructive changes are seen. The bladder is
partially distended.

Stomach/Bowel: Stomach is within normal limits. Appendix appears
normal. No evidence of bowel wall thickening, distention, or
inflammatory changes.

Vascular/Lymphatic: Aortic atherosclerosis. No enlarged abdominal or
pelvic lymph nodes.

Reproductive: Choose

Other: No abdominal wall hernia or abnormality. No abdominopelvic
ascites.

Musculoskeletal: No acute or significant osseous findings.
IMPRESSION: Scattered hypodensities within the kidneys likely representing
cysts. No other focal abnormality is noted.

## 2019-07-23 IMAGING — DX DG CHEST 2V
2 series · 2 of 2 positions shown · non-contrast
Comparison: Chest x-ray of 04/13/2017

CLINICAL DATA: Hyper for hypernatremia

EXAM:
CHEST  2 VIEW

[chest lat]
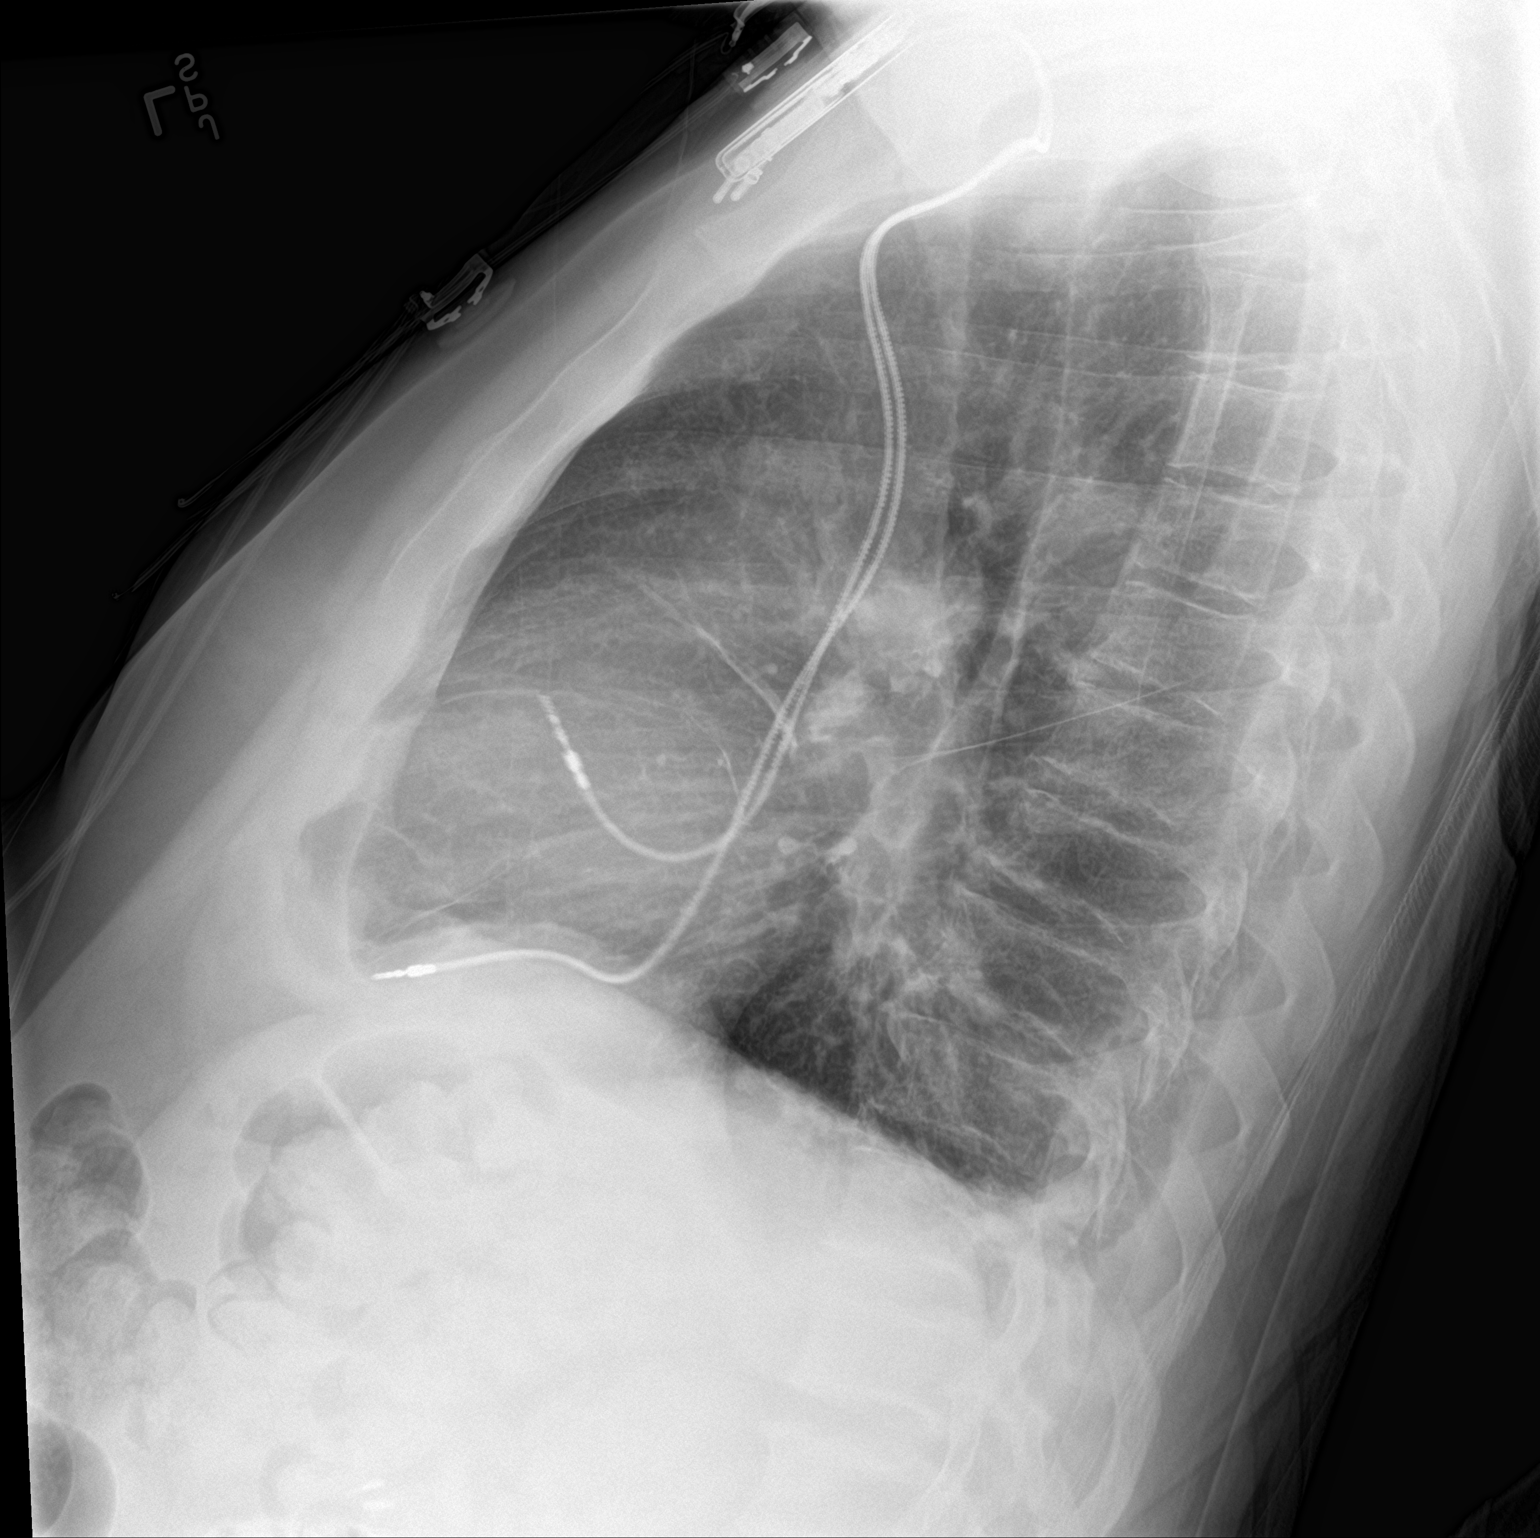

[chest ap]
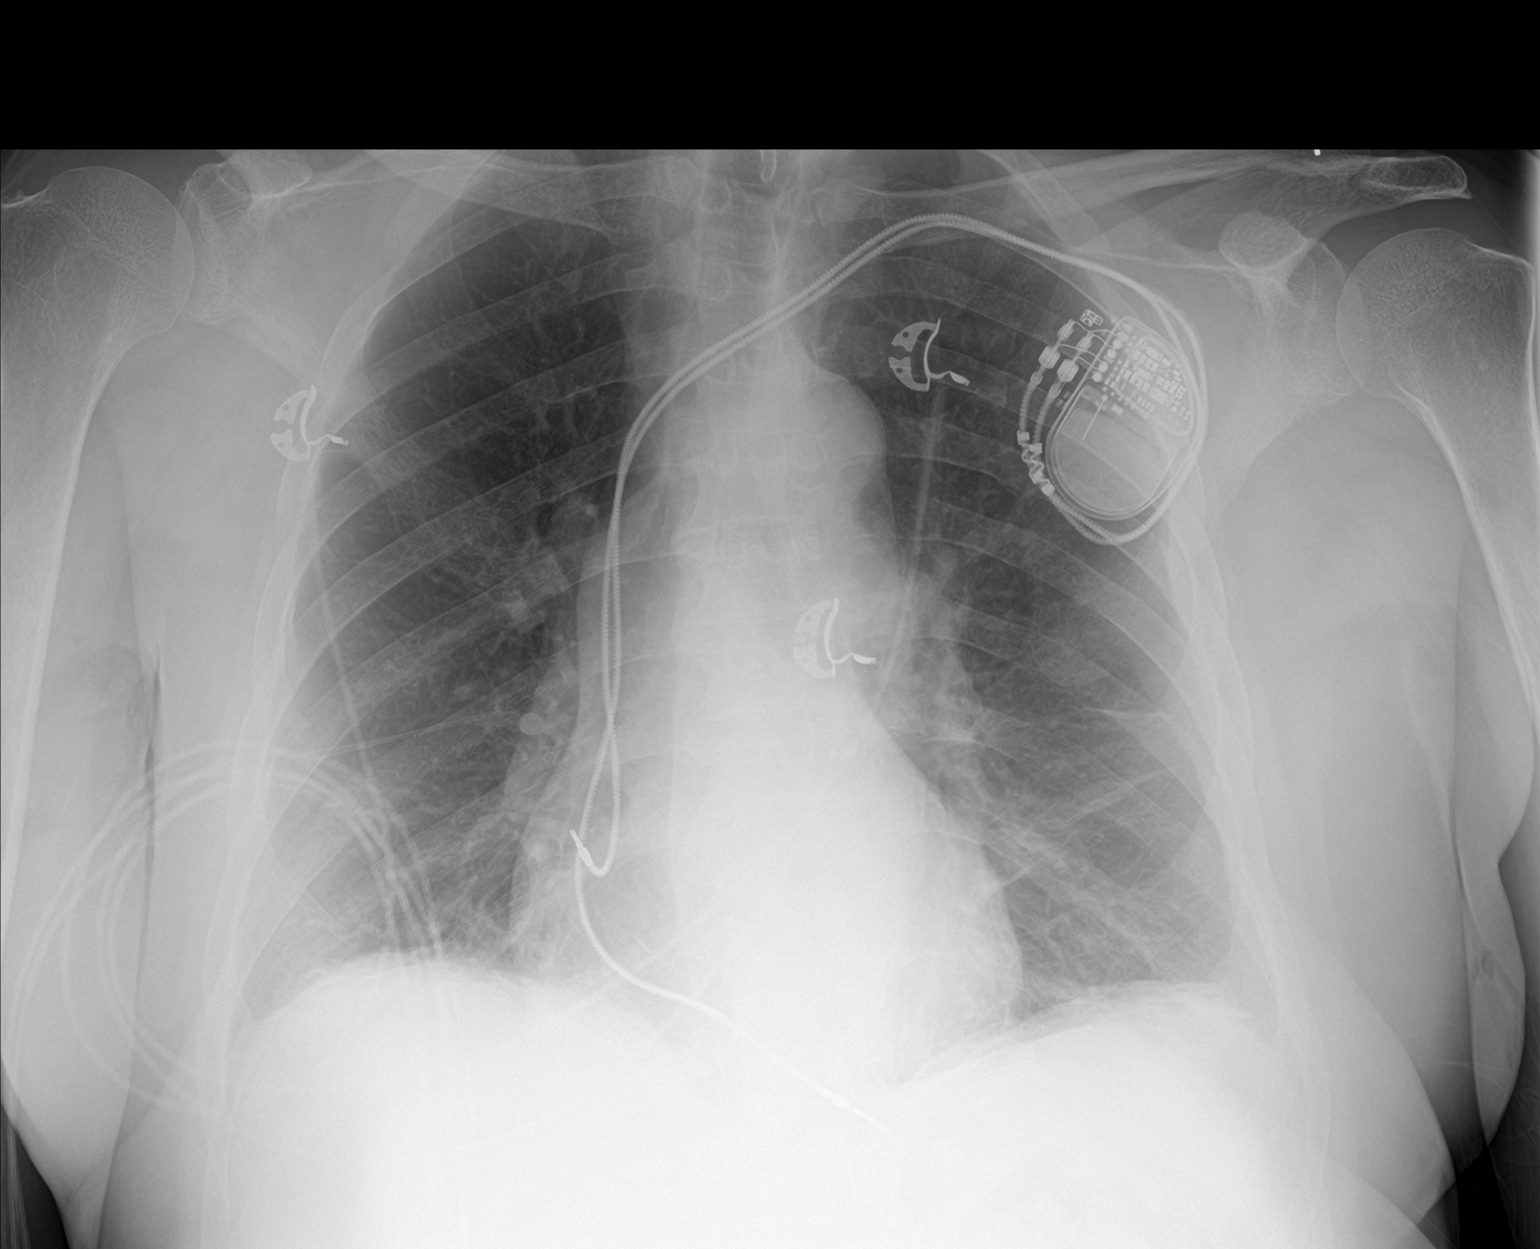

[2 of 2 positions shown; findings below may reference images not displayed]

FINDINGS: Bibasilar linear atelectasis and possible scarring is noted
bilaterally. No pneumonia or effusion is seen. Mediastinal and hilar
contours are unchanged and a dual lead permanent pacemaker remains.
No acute bony abnormality is seen.
IMPRESSION: Bibasilar linear atelectasis with some linear scarring present. No
definite active process.
